# Patient Record
Sex: Female | Born: 1965 | Race: Black or African American | Hispanic: No | Marital: Single | Smoking: Never smoker
Health system: Southern US, Community
[De-identification: ages and names within clinical notes are randomized; demographics above are authoritative.]

## PROBLEM LIST (undated history)

## (undated) DIAGNOSIS — D649 Anemia, unspecified: Secondary | ICD-10-CM

## (undated) DIAGNOSIS — I10 Essential (primary) hypertension: Secondary | ICD-10-CM

## (undated) DIAGNOSIS — R87619 Unspecified abnormal cytological findings in specimens from cervix uteri: Secondary | ICD-10-CM

## (undated) DIAGNOSIS — D219 Benign neoplasm of connective and other soft tissue, unspecified: Secondary | ICD-10-CM

## (undated) DIAGNOSIS — L509 Urticaria, unspecified: Secondary | ICD-10-CM

## (undated) HISTORY — PX: CHOLECYSTECTOMY: SHX55

## (undated) HISTORY — DX: Essential (primary) hypertension: I10

## (undated) HISTORY — DX: Benign neoplasm of connective and other soft tissue, unspecified: D21.9

## (undated) HISTORY — PX: ROBOTIC ASSISTED LAPAROSCOPIC VAGINAL HYSTERECTOMY WITH FIBROID REMOVAL: SHX5387

## (undated) HISTORY — DX: Urticaria, unspecified: L50.9

## (undated) HISTORY — DX: Unspecified abnormal cytological findings in specimens from cervix uteri: R87.619

## (undated) HISTORY — PX: COLONOSCOPY: SHX174

## (undated) HISTORY — PX: ABDOMINAL HYSTERECTOMY: SHX81

---

## 1994-12-11 HISTORY — PX: MYOMECTOMY: SHX85

## 1997-12-21 ENCOUNTER — Emergency Department (HOSPITAL_COMMUNITY): Admission: EM | Admit: 1997-12-21 | Discharge: 1997-12-22 | Payer: Self-pay | Admitting: Emergency Medicine

## 1998-09-11 HISTORY — PX: HYSTEROSCOPY: SHX211

## 1998-09-28 ENCOUNTER — Ambulatory Visit (HOSPITAL_COMMUNITY): Admission: RE | Admit: 1998-09-28 | Discharge: 1998-09-28 | Payer: Self-pay | Admitting: Obstetrics and Gynecology

## 1998-12-29 ENCOUNTER — Other Ambulatory Visit: Admission: RE | Admit: 1998-12-29 | Discharge: 1998-12-29 | Payer: Self-pay | Admitting: *Deleted

## 1999-01-11 ENCOUNTER — Ambulatory Visit (HOSPITAL_COMMUNITY): Admission: RE | Admit: 1999-01-11 | Discharge: 1999-01-11 | Payer: Self-pay | Admitting: *Deleted

## 1999-03-08 ENCOUNTER — Observation Stay (HOSPITAL_COMMUNITY): Admission: AD | Admit: 1999-03-08 | Discharge: 1999-03-09 | Payer: Self-pay | Admitting: Obstetrics and Gynecology

## 1999-03-08 ENCOUNTER — Encounter: Payer: Self-pay | Admitting: Obstetrics and Gynecology

## 1999-05-13 ENCOUNTER — Encounter: Payer: Self-pay | Admitting: *Deleted

## 1999-05-13 ENCOUNTER — Ambulatory Visit (HOSPITAL_COMMUNITY): Admission: RE | Admit: 1999-05-13 | Discharge: 1999-05-13 | Payer: Self-pay | Admitting: *Deleted

## 1999-07-12 ENCOUNTER — Inpatient Hospital Stay (HOSPITAL_COMMUNITY): Admission: RE | Admit: 1999-07-12 | Discharge: 1999-07-15 | Payer: Self-pay | Admitting: Obstetrics and Gynecology

## 1999-07-12 ENCOUNTER — Encounter (INDEPENDENT_AMBULATORY_CARE_PROVIDER_SITE_OTHER): Payer: Self-pay

## 2001-04-22 ENCOUNTER — Other Ambulatory Visit: Admission: RE | Admit: 2001-04-22 | Discharge: 2001-04-22 | Payer: Self-pay | Admitting: *Deleted

## 2003-07-22 ENCOUNTER — Encounter: Admission: RE | Admit: 2003-07-22 | Discharge: 2003-07-22 | Payer: Self-pay | Admitting: *Deleted

## 2005-09-18 ENCOUNTER — Encounter: Admission: RE | Admit: 2005-09-18 | Discharge: 2005-09-18 | Payer: Self-pay | Admitting: *Deleted

## 2007-01-21 ENCOUNTER — Encounter: Admission: RE | Admit: 2007-01-21 | Discharge: 2007-01-21 | Payer: Self-pay | Admitting: Obstetrics and Gynecology

## 2008-02-05 ENCOUNTER — Encounter: Admission: RE | Admit: 2008-02-05 | Discharge: 2008-02-05 | Payer: Self-pay | Admitting: Obstetrics and Gynecology

## 2010-12-16 ENCOUNTER — Other Ambulatory Visit: Payer: Self-pay | Admitting: Obstetrics & Gynecology

## 2010-12-16 DIAGNOSIS — Z1231 Encounter for screening mammogram for malignant neoplasm of breast: Secondary | ICD-10-CM

## 2010-12-20 ENCOUNTER — Ambulatory Visit
Admission: RE | Admit: 2010-12-20 | Discharge: 2010-12-20 | Disposition: A | Discharge status: Home / Self Care | Payer: BC Managed Care – PPO | Source: Ambulatory Visit | Attending: Obstetrics & Gynecology | Admitting: Obstetrics & Gynecology

## 2010-12-20 DIAGNOSIS — Z1231 Encounter for screening mammogram for malignant neoplasm of breast: Secondary | ICD-10-CM

## 2010-12-23 ENCOUNTER — Other Ambulatory Visit: Payer: Self-pay | Admitting: Obstetrics & Gynecology

## 2010-12-23 DIAGNOSIS — R928 Other abnormal and inconclusive findings on diagnostic imaging of breast: Secondary | ICD-10-CM

## 2011-01-05 ENCOUNTER — Ambulatory Visit
Admission: RE | Admit: 2011-01-05 | Discharge: 2011-01-05 | Disposition: A | Discharge status: Home / Self Care | Payer: BC Managed Care – PPO | Source: Ambulatory Visit | Attending: Obstetrics & Gynecology | Admitting: Obstetrics & Gynecology

## 2011-01-05 DIAGNOSIS — R928 Other abnormal and inconclusive findings on diagnostic imaging of breast: Secondary | ICD-10-CM

## 2011-09-11 ENCOUNTER — Other Ambulatory Visit: Payer: Self-pay | Admitting: Obstetrics and Gynecology

## 2011-09-11 ENCOUNTER — Other Ambulatory Visit: Payer: Self-pay | Admitting: Obstetrics & Gynecology

## 2011-09-11 DIAGNOSIS — R921 Mammographic calcification found on diagnostic imaging of breast: Secondary | ICD-10-CM

## 2011-09-14 ENCOUNTER — Ambulatory Visit
Admission: RE | Admit: 2011-09-14 | Discharge: 2011-09-14 | Disposition: A | Discharge status: Home / Self Care | Payer: BC Managed Care – PPO | Source: Ambulatory Visit | Attending: Obstetrics & Gynecology | Admitting: Obstetrics & Gynecology

## 2011-09-14 DIAGNOSIS — R921 Mammographic calcification found on diagnostic imaging of breast: Secondary | ICD-10-CM

## 2011-12-11 ENCOUNTER — Other Ambulatory Visit: Payer: Self-pay | Admitting: Obstetrics & Gynecology

## 2011-12-11 DIAGNOSIS — Z1231 Encounter for screening mammogram for malignant neoplasm of breast: Secondary | ICD-10-CM

## 2011-12-21 ENCOUNTER — Ambulatory Visit
Admission: RE | Admit: 2011-12-21 | Discharge: 2011-12-21 | Disposition: A | Discharge status: Home / Self Care | Payer: BC Managed Care – PPO | Source: Ambulatory Visit | Attending: Obstetrics & Gynecology | Admitting: Obstetrics & Gynecology

## 2011-12-21 DIAGNOSIS — Z1231 Encounter for screening mammogram for malignant neoplasm of breast: Secondary | ICD-10-CM

## 2012-12-16 ENCOUNTER — Encounter: Payer: Self-pay | Admitting: Certified Nurse Midwife

## 2012-12-18 ENCOUNTER — Other Ambulatory Visit: Payer: Self-pay

## 2012-12-18 DIAGNOSIS — Z1231 Encounter for screening mammogram for malignant neoplasm of breast: Secondary | ICD-10-CM

## 2012-12-23 ENCOUNTER — Encounter: Payer: Self-pay | Admitting: Certified Nurse Midwife

## 2012-12-23 ENCOUNTER — Ambulatory Visit (INDEPENDENT_AMBULATORY_CARE_PROVIDER_SITE_OTHER): Payer: BC Managed Care – PPO | Admitting: Certified Nurse Midwife

## 2012-12-23 VITALS — BP 120/80 | HR 64 | Resp 16 | Ht 60.25 in | Wt 172.0 lb

## 2012-12-23 DIAGNOSIS — Z0000 Encounter for general adult medical examination without abnormal findings: Secondary | ICD-10-CM

## 2012-12-23 DIAGNOSIS — Z01419 Encounter for gynecological examination (general) (routine) without abnormal findings: Secondary | ICD-10-CM

## 2012-12-23 LAB — POCT URINALYSIS DIPSTICK
Bilirubin, UA: NEGATIVE
Leukocytes, UA: NEGATIVE
Nitrite, UA: NEGATIVE
Protein, UA: NEGATIVE
pH, UA: 5

## 2012-12-23 NOTE — Patient Instructions (Signed)

## 2012-12-23 NOTE — Progress Notes (Signed)
47 y.o. G0P0000 Single African American Fe here for annual exam.  Denies vaginal bleeding or vaginal dryness.No health issues today. Sees PCP prn only. Working on weight loss with diet change and exercise now. Not sexually active  Patient's last menstrual period was 06/13/1999.          Sexually active: no  The current method of family planning is abstinence.    Exercising: yes  walking Smoker:  no  Health Maintenance: Pap:  12-04-08 neg MMG:  12-22-11 normal Colonoscopy:  none BMD:   none TDaP:  2013 Labs: Poct urine-neg, Hgb-13.7 Self breast exam: done monthly     Past Medical History  Diagnosis Date  . Leiomyoma   . Fibroid     Past Surgical History  Procedure Laterality Date  . Cholecystectomy    . Myomectomy  7/96    multiple  . Robotic assisted laparoscopic vaginal hysterectomy with fibroid removal    . Hysteroscopy  4/00    fibroid hysteroscopic resection  . Abdominal hysterectomy      TAH/LOA    Current Outpatient Prescriptions  Medication Sig Dispense Refill  . Cholecalciferol (VITAMIN D PO) Take by mouth. occ      . Multiple Vitamins-Minerals (MULTIVITAMIN PO) Take by mouth. occ       No current facility-administered medications for this visit.    Family History  Problem Relation Age of Onset  . Hypertension Mother   . Hypertension Father     ROS:  Pertinent items are noted in HPI.  Otherwise, a comprehensive ROS was negative.  Exam:   LMP 06/13/1999    Ht Readings from Last 3 Encounters:  No data found for Ht    General appearance: alert, cooperative and appears stated age Head: Normocephalic, without obvious abnormality, atraumatic Neck: no adenopathy, supple, symmetrical, trachea midline and thyroid normal to inspection and palpation Lungs: clear to auscultation bilaterally Breasts: normal appearance, no masses or tenderness, No nipple retraction or dimpling, No nipple discharge or bleeding, No axillary or supraclavicular adenopathy Heart:  regular rate and rhythm Abdomen: soft, non-tender; no masses,  no organomegaly Extremities: extremities normal, atraumatic, no cyanosis or edema Skin: Skin color, texture, turgor normal. No rashes or lesions Lymph nodes: Cervical, supraclavicular, and axillary nodes normal. No abnormal inguinal nodes palpated Neurologic: Grossly normal   Pelvic: External genitalia:  no lesions              Urethra:  normal appearing urethra with no masses, tenderness or lesions              Bartholin's and Skene's: normal                 Vagina: normal appearing vagina with normal color and discharge, no lesions              Cervix: absent              Pap taken: no Bimanual Exam:  Uterus:  uterus absent              Adnexa: normal adnexa, no mass, fullness, tenderness and  on right, left absent               Rectovaginal: Confirms               Anus:  normal sphincter tone, no lesions  A:  Well Woman with normal exam  S/PTAH/ LSO   Due to bleeding history of fibroids  P:   Reviewed health and wellness pertinent  to exam  Labs: CMP, Lipid, TSH, Vitamin D(fasting)   Pap smear as per guidelines   Mammogram yearly pap smear not taken today  counseled on breast self exam, mammography screening, adequate intake of calcium and vitamin D, continue to work on weight to decrease health risk  return annually or prn  An After Visit Summary was printed and given to the patient.  Reviewed, TL

## 2012-12-24 ENCOUNTER — Telehealth: Payer: Self-pay

## 2012-12-24 LAB — COMPREHENSIVE METABOLIC PANEL
AST: 14 U/L (ref 0–37)
Albumin: 4.1 g/dL (ref 3.5–5.2)
Alkaline Phosphatase: 37 U/L — ABNORMAL LOW (ref 39–117)
Potassium: 4.2 mEq/L (ref 3.5–5.3)
Sodium: 141 mEq/L (ref 135–145)
Total Protein: 6.7 g/dL (ref 6.0–8.3)

## 2012-12-24 LAB — LIPID PANEL
Total CHOL/HDL Ratio: 3.5 Ratio
VLDL: 20 mg/dL (ref 0–40)

## 2012-12-24 LAB — TSH: TSH: 2.087 u[IU]/mL (ref 0.350–4.500)

## 2012-12-24 NOTE — Telephone Encounter (Signed)
lmtcb

## 2012-12-24 NOTE — Telephone Encounter (Signed)
Patient returning Joy's call.  °

## 2012-12-24 NOTE — Telephone Encounter (Signed)
Message copied by Eliezer Bottom on Tue Dec 24, 2012  9:11 AM ------      Message from: Verner Chol      Created: Tue Dec 24, 2012  8:14 AM       Notify TSH, liver, kidney, glucose profile normal      Vitamin D low, protocol      Cholesterol borderline high 208 should be  Less than 200, work on exercise as we discussed      LDL optimal level ------

## 2012-12-24 NOTE — Telephone Encounter (Signed)
Patient notified of results.

## 2013-01-07 ENCOUNTER — Ambulatory Visit
Admission: RE | Admit: 2013-01-07 | Discharge: 2013-01-07 | Disposition: A | Discharge status: Home / Self Care | Payer: BC Managed Care – PPO | Source: Ambulatory Visit

## 2013-01-07 DIAGNOSIS — Z1231 Encounter for screening mammogram for malignant neoplasm of breast: Secondary | ICD-10-CM

## 2013-12-05 ENCOUNTER — Other Ambulatory Visit: Payer: Self-pay

## 2013-12-05 DIAGNOSIS — Z1231 Encounter for screening mammogram for malignant neoplasm of breast: Secondary | ICD-10-CM

## 2013-12-25 ENCOUNTER — Ambulatory Visit (INDEPENDENT_AMBULATORY_CARE_PROVIDER_SITE_OTHER): Payer: BC Managed Care – PPO | Admitting: Certified Nurse Midwife

## 2013-12-25 ENCOUNTER — Encounter: Payer: Self-pay | Admitting: Certified Nurse Midwife

## 2013-12-25 VITALS — BP 138/90 | HR 72 | Ht 60.25 in | Wt 170.0 lb

## 2013-12-25 DIAGNOSIS — Z01419 Encounter for gynecological examination (general) (routine) without abnormal findings: Secondary | ICD-10-CM

## 2013-12-25 DIAGNOSIS — Z0000 Encounter for general adult medical examination without abnormal findings: Secondary | ICD-10-CM

## 2013-12-25 LAB — COMPREHENSIVE METABOLIC PANEL
ALK PHOS: 46 U/L (ref 39–117)
ALT: 13 U/L (ref 0–35)
AST: 16 U/L (ref 0–37)
Albumin: 4 g/dL (ref 3.5–5.2)
BILIRUBIN TOTAL: 0.4 mg/dL (ref 0.2–1.2)
BUN: 10 mg/dL (ref 6–23)
CO2: 29 mEq/L (ref 19–32)
Calcium: 9.7 mg/dL (ref 8.4–10.5)
Chloride: 100 mEq/L (ref 96–112)
Creat: 0.79 mg/dL (ref 0.50–1.10)
GLUCOSE: 93 mg/dL (ref 70–99)
POTASSIUM: 3.7 meq/L (ref 3.5–5.3)
SODIUM: 138 meq/L (ref 135–145)
TOTAL PROTEIN: 7.2 g/dL (ref 6.0–8.3)

## 2013-12-25 LAB — POCT URINALYSIS DIPSTICK
BILIRUBIN UA: NEGATIVE
Blood, UA: NEGATIVE
GLUCOSE UA: NEGATIVE
KETONES UA: NEGATIVE
LEUKOCYTES UA: NEGATIVE
NITRITE UA: NEGATIVE
PH UA: 6
Protein, UA: NEGATIVE
Urobilinogen, UA: NEGATIVE

## 2013-12-25 LAB — LIPID PANEL
Cholesterol: 189 mg/dL (ref 0–200)
HDL: 60 mg/dL (ref >39)
LDL CALC: 97 mg/dL (ref 0–99)
TRIGLYCERIDES: 162 mg/dL — AB (ref <150)
Total CHOL/HDL Ratio: 3.2 Ratio
VLDL: 32 mg/dL (ref 0–40)

## 2013-12-25 LAB — HEMOGLOBIN, FINGERSTICK: HEMOGLOBIN, FINGERSTICK: 13.3 g/dL (ref 12.0–16.0)

## 2013-12-25 NOTE — Progress Notes (Signed)
48 y.o. G0P0000 Single African American Fe here for annual exam. Perimenopausal with occasional hot flashes and night sweats. Patient has stopped large amounts of caffeine with coffee and started to use decaf tea with less hot flashes. Working on weight loss now and looking fora exercise she likes. Not sexually active in a long time. No STD screening needed. Sees PCP prn. Fasted for labs this am. No other health issues today.  Patient's last menstrual period was 07/12/1999.          Sexually active: No.  The current method of family planning is status post hysterectomy.    Exercising: No.  The patient does not participate in regular exercise at present. Smoker:  no  Health Maintenance: Pap:  12/04/2008 MMG:  12/18/12, WNL scheduled Category C, Birads negative 1 Colonoscopy:  never BMD:   never TDaP:  2013 Labs: Hgb-  13.3           Urine-Normal   reports that she has never smoked. She does not have any smokeless tobacco history on file. She reports that she does not drink alcohol or use illicit drugs.  Past Medical History  Diagnosis Date  . Leiomyoma   . Fibroid     Past Surgical History  Procedure Laterality Date  . Cholecystectomy    . Myomectomy  7/96    multiple  . Robotic assisted laparoscopic vaginal hysterectomy with fibroid removal    . Hysteroscopy  4/00    fibroid hysteroscopic resection  . Abdominal hysterectomy      TAH/LOA    Current Outpatient Prescriptions  Medication Sig Dispense Refill  . EPINEPHrine (EPI-PEN) 0.3 mg/0.3 mL SOAJ Inject into the muscle once. prn      . Multiple Vitamins-Minerals (MULTIVITAMIN PO) Take by mouth. occ      . Cholecalciferol (VITAMIN D PO) Take by mouth. occ      . COLLAGEN PO Take by mouth. occ       No current facility-administered medications for this visit.    Family History  Problem Relation Age of Onset  . Hypertension Mother   . Hypertension Father   . Diabetes Paternal Aunt   . Cancer Paternal Uncle     bone  .  Cancer Paternal Grandmother     bone    ROS:  Pertinent items are noted in HPI.  Otherwise, a comprehensive ROS was negative.  Exam:   BP 138/90  Pulse 72  Ht 5' 0.25" (1.53 m)  Wt 170 lb (77.111 kg)  BMI 32.94 kg/m2  LMP 07/12/1999 Height: 5' 0.25" (153 cm)  Ht Readings from Last 3 Encounters:  12/25/13 5' 0.25" (1.53 m)  12/23/12 5' 0.25" (1.53 m)    General appearance: alert, cooperative and appears stated age Head: Normocephalic, without obvious abnormality, atraumatic Neck: no adenopathy, supple, symmetrical, trachea midline and thyroid normal to inspection and palpation and non-palpable Lungs: clear to auscultation bilaterally Breasts: normal appearance, no masses or tenderness, No nipple retraction or dimpling, No nipple discharge or bleeding, No axillary or supraclavicular adenopathy Heart: regular rate and rhythm Abdomen: soft, non-tender; no masses,  no organomegaly Extremities: extremities normal, atraumatic, no cyanosis or edema Skin: Skin color, texture, turgor normal. No rashes or lesions Lymph nodes: Cervical, supraclavicular, and axillary nodes normal. No abnormal inguinal nodes palpated Neurologic: Grossly normal   Pelvic: External genitalia:  no lesions              Urethra:  normal appearing urethra with no masses, tenderness or lesions  Bartholin's and Skene's: normal                 Vagina: normal appearing vagina with normal color and discharge, no lesions              Cervix: absent              Pap taken: No. Bimanual Exam:  Uterus:  uterus absent              Adnexa: normal adnexa and no mass, fullness, tenderness               Rectovaginal: Confirms               Anus:  normal sphincter tone, no lesions  A:  Well Woman with normal exam  Contraception none, S/P TAH for fibroid ovaries retained  History of Vitamin D deficicency  P:   Reviewed health and wellness pertinent to exam  Labs:CMP,Lipid panel, Vitamin D  Pap smear not taken  today   counseled on breast self exam, mammography screening, menopause, adequate intake of calcium and vitamin D, diet and exercise  return annually or prn  An After Visit Summary was printed and given to the patient.

## 2013-12-25 NOTE — Progress Notes (Signed)
Reviewed personally.  M. Suzanne Ottilie Wigglesworth, MD.  

## 2013-12-26 LAB — VITAMIN D 25 HYDROXY (VIT D DEFICIENCY, FRACTURES): Vit D, 25-Hydroxy: 30 ng/mL (ref 30–89)

## 2014-01-08 ENCOUNTER — Ambulatory Visit
Admission: RE | Admit: 2014-01-08 | Discharge: 2014-01-08 | Disposition: A | Discharge status: Home / Self Care | Payer: BC Managed Care – PPO | Source: Ambulatory Visit

## 2014-01-08 DIAGNOSIS — Z1231 Encounter for screening mammogram for malignant neoplasm of breast: Secondary | ICD-10-CM

## 2014-12-09 ENCOUNTER — Other Ambulatory Visit: Payer: Self-pay

## 2014-12-09 DIAGNOSIS — Z1231 Encounter for screening mammogram for malignant neoplasm of breast: Secondary | ICD-10-CM

## 2014-12-29 ENCOUNTER — Ambulatory Visit (INDEPENDENT_AMBULATORY_CARE_PROVIDER_SITE_OTHER): Payer: BC Managed Care – PPO | Admitting: Certified Nurse Midwife

## 2014-12-29 ENCOUNTER — Encounter: Payer: Self-pay | Admitting: Certified Nurse Midwife

## 2014-12-29 VITALS — BP 120/70 | HR 80 | Resp 16 | Ht 60.0 in | Wt 165.4 lb

## 2014-12-29 DIAGNOSIS — Z01419 Encounter for gynecological examination (general) (routine) without abnormal findings: Secondary | ICD-10-CM | POA: Diagnosis not present

## 2014-12-29 DIAGNOSIS — Z0000 Encounter for general adult medical examination without abnormal findings: Secondary | ICD-10-CM

## 2014-12-29 LAB — LIPID PANEL
Cholesterol: 202 mg/dL — ABNORMAL HIGH (ref 0–200)
HDL: 66 mg/dL (ref >=46)
LDL Cholesterol: 115 mg/dL — ABNORMAL HIGH (ref 0–99)
Total CHOL/HDL Ratio: 3.1 Ratio
Triglycerides: 103 mg/dL (ref <150)
VLDL: 21 mg/dL (ref 0–40)

## 2014-12-29 LAB — POCT URINALYSIS DIPSTICK
LEUKOCYTES UA: NEGATIVE
UROBILINOGEN UA: NEGATIVE
pH, UA: 5

## 2014-12-29 LAB — HEMOGLOBIN, FINGERSTICK: Hemoglobin, fingerstick: 13.4 g/dL (ref 12.0–16.0)

## 2014-12-29 NOTE — Progress Notes (Signed)
49 y.o. G0P0000 Single  African American Fe here for annual exam. Perimenopausal with occasional hot flash with hot foods only.            Denies vaginal bleeding or vaginal dryness. 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 Continues to work on weight loss and reducing salt in diet. Sees Urgent care if needed. No health issues today.   Patient's last menstrual period was 07/12/1999.          Sexually active: No.  The current method of family planning is status post hysterectomy.    Exercising: Yes.    walking Smoker:  no  Health Maintenance: Pap:  12/04/2008 wnl MMG:  01/08/14 breast density cat c; bi-rads 1: negative, has scheduled in 8/16 Colonoscopy:  Never had one, plans after turn 49  BMD:   Never had one TDaP:  12/19/2011 Labs: HGB: 13.4 ; Urine: Negative    reports that she has never smoked. She does not have any smokeless tobacco history on file. She reports that she does not drink alcohol or use illicit drugs.  Past Medical History  Diagnosis Date  . Leiomyoma   . Fibroid     Past Surgical History  Procedure Laterality Date  . Cholecystectomy    . Myomectomy  7/96    multiple  . Robotic assisted laparoscopic vaginal hysterectomy with fibroid removal    . Hysteroscopy  4/00    fibroid hysteroscopic resection  . Abdominal hysterectomy      TAH/LOA    Current Outpatient Prescriptions  Medication Sig Dispense Refill  . Cholecalciferol (VITAMIN D PO) Take by mouth. occ    . EPINEPHrine (EPI-PEN) 0.3 mg/0.3 mL SOAJ Inject into the muscle once. prn    . Multiple Vitamins-Minerals (MULTIVITAMIN PO) Take by mouth. occ    . COLLAGEN PO Take by mouth. occ     No current facility-administered medications for this visit.    Family History  Problem Relation Age of Onset  . Hypertension Mother   . Hypertension Father   . Diabetes Paternal Aunt   . Cancer Paternal Uncle     bone  . Cancer Paternal Grandmother     bone     ROS:  Pertinent items are noted in HPI.  Otherwise, a comprehensive ROS was negative.  Exam:   BP 136/78 mmHg  Pulse 82  Ht 5' (1.524 m)  Wt 165 lb 6.4 oz (75.025 kg)  BMI 32.30 kg/m2  LMP 07/12/1999 Height: 5' (152.4 cm) Ht Readings from Last 3 Encounters:  12/29/14 5' (1.524 m)  12/25/13 5' 0.25" (1.53 m)  12/23/12 5' 0.25" (1.53 m)    General appearance: alert, cooperative and appears stated age Head: Normocephalic, without obvious abnormality, atraumatic Neck: no adenopathy, supple, symmetrical, trachea midline and thyroid normal to inspection and palpation Lungs: clear to auscultation bilaterally Breasts: normal appearance, no masses or tenderness, No nipple retraction or dimpling, No nipple discharge or bleeding, No axillary or supraclavicular adenopathy Heart: regular rate and rhythm Abdomen: soft, non-tender; no masses,  no organomegaly Extremities: extremities normal, atraumatic, no cyanosis or edema Skin: Skin color, texture, turgor normal. No rashes or lesions Lymph nodes: Cervical, supraclavicular, and axillary nodes normal. No abnormal inguinal nodes palpated Neurologic: Grossly normal   Pelvic: External genitalia:  no lesions              Urethra:  normal appearing urethra with no masses, tenderness or lesions  Bartholin's and Skene's: normal                 Vagina: normal appearing vagina with normal color and discharge, no lesions              Cervix: absent              Pap taken: No. Bimanual Exam:  Uterus:  normal size, contour, position, consistency, mobility, non-tender              Adnexa: normal adnexa and no mass, fullness, tenderness               Rectovaginal: Confirms               Anus:  normal sphincter tone, no lesions  Chaperone present: Yes  A:  Well Woman with normal exam  Perimenopausal S/P TAH with ovaries retained fibroids  Screening labs   P:   Reviewed health and wellness pertinent to exam  Patient aware of changes  with perimenopause and will advise if concerns.  Labs: TSH, Lipid Panel, Vitamin D  Pap smear not taken   counseled on breast self exam, mammography screening, STD prevention, HIV risk factors and prevention, adequate intake of calcium and vitamin D, diet and exercise  return annually or prn  An After Visit Summary was printed and given to the patient.

## 2014-12-29 NOTE — Patient Instructions (Signed)

## 2014-12-29 NOTE — Progress Notes (Signed)
Reviewed personally.  M. Suzanne Traquan Duarte, MD.  

## 2014-12-30 ENCOUNTER — Telehealth: Payer: Self-pay

## 2014-12-30 ENCOUNTER — Other Ambulatory Visit: Payer: Self-pay

## 2014-12-30 DIAGNOSIS — E559 Vitamin D deficiency, unspecified: Secondary | ICD-10-CM

## 2014-12-30 LAB — TSH: TSH: 1.579 u[IU]/mL (ref 0.350–4.500)

## 2014-12-30 LAB — VITAMIN D 25 HYDROXY (VIT D DEFICIENCY, FRACTURES): VIT D 25 HYDROXY: 17 ng/mL — AB (ref 30–100)

## 2014-12-30 MED ORDER — VITAMIN D (ERGOCALCIFEROL) 1.25 MG (50000 UNIT) PO CAPS: 50000.0000 [IU] | ORAL_CAPSULE | ORAL | Status: DC

## 2014-12-30 NOTE — Telephone Encounter (Signed)
rx for vit d 50,000iu once weekly for 78mths. See lab results

## 2014-12-30 NOTE — Telephone Encounter (Signed)
lmtb

## 2014-12-30 NOTE — Telephone Encounter (Signed)
Patient notified of results. See lab 

## 2014-12-30 NOTE — Telephone Encounter (Signed)
-----   Message from Regina Eck, CNM sent at 12/30/2014  8:24 AM EDT ----- Notify patient that vitamin D is low again protocol TSH is normal Lipid panel near optimal level

## 2015-01-11 ENCOUNTER — Ambulatory Visit
Admission: RE | Admit: 2015-01-11 | Discharge: 2015-01-11 | Disposition: A | Discharge status: Home / Self Care | Payer: BC Managed Care – PPO | Source: Ambulatory Visit

## 2015-01-11 DIAGNOSIS — Z1231 Encounter for screening mammogram for malignant neoplasm of breast: Secondary | ICD-10-CM

## 2015-05-03 ENCOUNTER — Other Ambulatory Visit (INDEPENDENT_AMBULATORY_CARE_PROVIDER_SITE_OTHER): Payer: BC Managed Care – PPO

## 2015-05-03 DIAGNOSIS — E559 Vitamin D deficiency, unspecified: Secondary | ICD-10-CM

## 2015-05-04 LAB — VITAMIN D 25 HYDROXY (VIT D DEFICIENCY, FRACTURES): VIT D 25 HYDROXY: 25 ng/mL — AB (ref 30–100)

## 2015-06-13 HISTORY — PX: BREAST BIOPSY: SHX20

## 2015-12-24 ENCOUNTER — Other Ambulatory Visit: Payer: Self-pay | Admitting: Certified Nurse Midwife

## 2015-12-24 ENCOUNTER — Other Ambulatory Visit: Payer: Self-pay | Admitting: General Practice

## 2015-12-24 DIAGNOSIS — Z1231 Encounter for screening mammogram for malignant neoplasm of breast: Secondary | ICD-10-CM

## 2015-12-31 ENCOUNTER — Ambulatory Visit: Payer: BC Managed Care – PPO | Admitting: Certified Nurse Midwife

## 2016-01-06 ENCOUNTER — Ambulatory Visit (INDEPENDENT_AMBULATORY_CARE_PROVIDER_SITE_OTHER): Payer: BC Managed Care – PPO | Admitting: Certified Nurse Midwife

## 2016-01-06 ENCOUNTER — Encounter: Payer: Self-pay | Admitting: Certified Nurse Midwife

## 2016-01-06 VITALS — BP 110/70 | HR 70 | Resp 16 | Ht 60.25 in | Wt 169.0 lb

## 2016-01-06 DIAGNOSIS — Z01419 Encounter for gynecological examination (general) (routine) without abnormal findings: Secondary | ICD-10-CM

## 2016-01-06 DIAGNOSIS — Z0000 Encounter for general adult medical examination without abnormal findings: Secondary | ICD-10-CM | POA: Diagnosis not present

## 2016-01-06 DIAGNOSIS — R319 Hematuria, unspecified: Secondary | ICD-10-CM

## 2016-01-06 LAB — COMPREHENSIVE METABOLIC PANEL
ALBUMIN: 4.1 g/dL (ref 3.6–5.1)
ALT: 12 U/L (ref 6–29)
AST: 17 U/L (ref 10–35)
Alkaline Phosphatase: 59 U/L (ref 33–130)
BILIRUBIN TOTAL: 0.4 mg/dL (ref 0.2–1.2)
BUN: 8 mg/dL (ref 7–25)
CO2: 25 mmol/L (ref 20–31)
CREATININE: 0.81 mg/dL (ref 0.50–1.05)
Calcium: 9.3 mg/dL (ref 8.6–10.4)
Chloride: 103 mmol/L (ref 98–110)
Glucose, Bld: 90 mg/dL (ref 65–99)
Potassium: 3.8 mmol/L (ref 3.5–5.3)
SODIUM: 139 mmol/L (ref 135–146)
TOTAL PROTEIN: 7.1 g/dL (ref 6.1–8.1)

## 2016-01-06 LAB — POCT URINALYSIS DIPSTICK
Bilirubin, UA: NEGATIVE
Glucose, UA: NEGATIVE
Ketones, UA: NEGATIVE
Leukocytes, UA: NEGATIVE
NITRITE UA: NEGATIVE
PH UA: 5
Protein, UA: NEGATIVE
UROBILINOGEN UA: NEGATIVE

## 2016-01-06 LAB — LIPID PANEL
Cholesterol: 195 mg/dL (ref 125–200)
HDL: 61 mg/dL (ref >=46)
LDL Cholesterol: 95 mg/dL (ref <130)
Total CHOL/HDL Ratio: 3.2 Ratio (ref <=5.0)
Triglycerides: 193 mg/dL — ABNORMAL HIGH (ref <150)
VLDL: 39 mg/dL — ABNORMAL HIGH (ref <30)

## 2016-01-06 LAB — TSH: TSH: 1.13 m[IU]/L

## 2016-01-06 NOTE — Progress Notes (Signed)
50 y.o. G0P0000 Single  African American Fe here for annual exam. Menopausal no HRT. Denies vaginal dryness or vaginal bleeding. No UTI symptoms. Has not noted blood in urine. Drinking adequate water daily. Sees Urgent care if needed. No health issues today. Still struggling with taking Vitamin D supplement, but trying. Enjoying her summer!2   Patient's last menstrual period was 07/12/1999.          Sexually active: No.  The current method of family planning is status post hysterectomy.    Exercising: Yes.    walking Smoker:  no  Health Maintenance: Pap:  12-04-08 neg MMG:  01-25-15 neg scheduled already Colonoscopy:  none BMD:   none TDaP:  2013 Shingles: no Pneumonia: no Hep C and HIV: yrs ago Labs: poct urine -rbc tr, hgb-12.6 Self breast exam: done monthly   reports that she has never smoked. She does not have any smokeless tobacco history on file. She reports that she does not drink alcohol or use drugs.  Past Medical History:  Diagnosis Date  . Fibroid   . Leiomyoma     Past Surgical History:  Procedure Laterality Date  . ABDOMINAL HYSTERECTOMY     TAH/LOA  . CHOLECYSTECTOMY    . HYSTEROSCOPY  4/00   fibroid hysteroscopic resection  . MYOMECTOMY  7/96   multiple  . ROBOTIC ASSISTED LAPAROSCOPIC VAGINAL HYSTERECTOMY WITH FIBROID REMOVAL      Current Outpatient Prescriptions  Medication Sig Dispense Refill  . Cholecalciferol (VITAMIN D PO) Take by mouth. occ    . COLLAGEN PO Take by mouth. occ    . EPINEPHrine (EPI-PEN) 0.3 mg/0.3 mL SOAJ Inject into the muscle once. prn    . Multiple Vitamins-Minerals (MULTIVITAMIN PO) Take by mouth. occ    . Vitamin D, Ergocalciferol, (DRISDOL) 50000 UNITS CAPS capsule Take 1 capsule (50,000 Units total) by mouth every 7 (seven) days. 8 capsule 0   No current facility-administered medications for this visit.     Family History  Problem Relation Age of Onset  . Hypertension Mother   . Hypertension Father   . Diabetes  Paternal Aunt   . Cancer Paternal Uncle     bone  . Cancer Paternal Grandmother     bone    ROS:  Pertinent items are noted in HPI.  Otherwise, a comprehensive ROS was negative.  Exam:   LMP 07/12/1999    Ht Readings from Last 3 Encounters:  12/29/14 5' (1.524 m)  12/25/13 5' 0.25" (1.53 m)  12/23/12 5' 0.25" (1.53 m)    General appearance: alert, cooperative and appears stated age Head: Normocephalic, without obvious abnormality, atraumatic Neck: no adenopathy, supple, symmetrical, trachea midline and thyroid normal to inspection and palpation Lungs: clear to auscultation bilaterally CVAT negative bilateral Breasts: normal appearance, no masses or tenderness, No nipple retraction or dimpling, No nipple discharge or bleeding, No axillary or supraclavicular adenopathy Heart: regular rate and rhythm Abdomen: soft, non-tender; no masses,  no organomegaly, negative suprapubic Extremities: extremities normal, atraumatic, no cyanosis or edema Skin: Skin color, texture, turgor normal. No rashes or lesions, warm and dry Lymph nodes: Cervical, supraclavicular, and axillary nodes normal. No abnormal inguinal nodes palpated Neurologic: Grossly normal   Pelvic: External genitalia:  no lesions, normal female              Urethra:  normal appearing urethra with no masses, tenderness or lesions  Bladder and urethral meatus non tender  Bartholin's and Skene's: normal                 Vagina: normal appearing vagina with normal color and discharge, no lesions              Cervix: absent              Pap taken: No. Bimanual Exam:  Uterus:  uterus absent              Adnexa: no mass, fullness, tenderness, adnexa not palpable, large masses  noted               Rectovaginal: Confirms               Anus:  normal sphincter tone, no lesions  Chaperone present: yes  A:  Well Woman with normal exam  Perimenopausal not symptomatic  S/P TAH/LOA due to fibroid and bleeding  Screening  labs  P:   Reviewed health and wellness pertinent to exam  Discussed etiology of perimenopause and handout given.  R/O UTI hematuria  Lab: Vitamin D, TSH,CMP,Lipid panel  Pap smear as above not taken   counseled on breast self exam, mammography screening, adequate intake of calcium and vitamin D, diet and exercise   Rv aex, prn  An After Visit Summary was printed and given to the patient.

## 2016-01-06 NOTE — Patient Instructions (Signed)

## 2016-01-07 ENCOUNTER — Other Ambulatory Visit: Payer: Self-pay

## 2016-01-07 ENCOUNTER — Telehealth: Payer: Self-pay | Admitting: Certified Nurse Midwife

## 2016-01-07 DIAGNOSIS — E559 Vitamin D deficiency, unspecified: Secondary | ICD-10-CM

## 2016-01-07 LAB — URINALYSIS, MICROSCOPIC ONLY
Bacteria, UA: NONE SEEN [HPF]
CASTS: NONE SEEN [LPF]
CRYSTALS: NONE SEEN [HPF]
RBC / HPF: NONE SEEN RBC/HPF (ref <=2)
WBC, UA: NONE SEEN WBC/HPF (ref <=5)
YEAST: NONE SEEN [HPF]

## 2016-01-07 LAB — HEMOGLOBIN, FINGERSTICK: Hemoglobin, fingerstick: 12.6 g/dL (ref 12.0–16.0)

## 2016-01-07 LAB — VITAMIN D 25 HYDROXY (VIT D DEFICIENCY, FRACTURES): VIT D 25 HYDROXY: 19 ng/mL — AB (ref 30–100)

## 2016-01-07 MED ORDER — VITAMIN D (ERGOCALCIFEROL) 1.25 MG (50000 UNIT) PO CAPS: 50000.0000 [IU] | ORAL_CAPSULE | ORAL | 0 refills | Status: DC

## 2016-01-07 NOTE — Telephone Encounter (Signed)
Patient states she received call from Caryl Asp, Mrs Debbi's nurse assistant. States she provided a voicemail with her lab results and stated she needs a 3 month lab follow up to recheck her vitamin d. States the voicemail stated a vitamin D prescription was sent to her pharmacy. 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  Scheduled for 04/10/16 @ 330pm.  Patient agreeable to return call from nurse if office visit required instead of a lab only.  Original message to patient recorded in labs.

## 2016-01-10 NOTE — Progress Notes (Signed)
Encounter reviewed Cindy Wessler, MD   

## 2016-01-12 ENCOUNTER — Ambulatory Visit
Admission: RE | Admit: 2016-01-12 | Discharge: 2016-01-12 | Disposition: A | Discharge status: Home / Self Care | Payer: BC Managed Care – PPO | Source: Ambulatory Visit | Attending: Cardiology | Admitting: Cardiology

## 2016-01-12 DIAGNOSIS — Z1231 Encounter for screening mammogram for malignant neoplasm of breast: Secondary | ICD-10-CM

## 2016-01-13 ENCOUNTER — Other Ambulatory Visit: Payer: Self-pay | Admitting: Certified Nurse Midwife

## 2016-01-13 DIAGNOSIS — R928 Other abnormal and inconclusive findings on diagnostic imaging of breast: Secondary | ICD-10-CM

## 2016-01-20 ENCOUNTER — Ambulatory Visit
Admission: RE | Admit: 2016-01-20 | Discharge: 2016-01-20 | Disposition: A | Discharge status: Home / Self Care | Payer: BC Managed Care – PPO | Source: Ambulatory Visit | Attending: Certified Nurse Midwife | Admitting: Certified Nurse Midwife

## 2016-01-20 ENCOUNTER — Other Ambulatory Visit: Payer: Self-pay | Admitting: Certified Nurse Midwife

## 2016-01-20 DIAGNOSIS — R928 Other abnormal and inconclusive findings on diagnostic imaging of breast: Secondary | ICD-10-CM

## 2016-01-25 ENCOUNTER — Other Ambulatory Visit: Payer: Self-pay | Admitting: Certified Nurse Midwife

## 2016-01-25 DIAGNOSIS — R928 Other abnormal and inconclusive findings on diagnostic imaging of breast: Secondary | ICD-10-CM

## 2016-01-26 ENCOUNTER — Ambulatory Visit
Admission: RE | Admit: 2016-01-26 | Discharge: 2016-01-26 | Disposition: A | Discharge status: Home / Self Care | Payer: BC Managed Care – PPO | Source: Ambulatory Visit | Attending: Certified Nurse Midwife | Admitting: Certified Nurse Midwife

## 2016-01-26 DIAGNOSIS — R928 Other abnormal and inconclusive findings on diagnostic imaging of breast: Secondary | ICD-10-CM

## 2016-04-10 ENCOUNTER — Other Ambulatory Visit: Payer: BC Managed Care – PPO

## 2016-04-10 DIAGNOSIS — E559 Vitamin D deficiency, unspecified: Secondary | ICD-10-CM

## 2016-04-11 ENCOUNTER — Other Ambulatory Visit: Payer: Self-pay | Admitting: Certified Nurse Midwife

## 2016-04-11 ENCOUNTER — Other Ambulatory Visit: Payer: Self-pay

## 2016-04-11 DIAGNOSIS — E559 Vitamin D deficiency, unspecified: Secondary | ICD-10-CM

## 2016-04-11 LAB — VITAMIN D 25 HYDROXY (VIT D DEFICIENCY, FRACTURES): VIT D 25 HYDROXY: 33 ng/mL (ref 30–100)

## 2016-04-11 MED ORDER — VITAMIN D (ERGOCALCIFEROL) 1.25 MG (50000 UNIT) PO CAPS
ORAL_CAPSULE | ORAL | 0 refills | Status: DC
Start: 2016-04-11 — End: 2017-01-09

## 2016-04-11 NOTE — Progress Notes (Signed)
Once monthly

## 2016-08-15 ENCOUNTER — Other Ambulatory Visit: Payer: Self-pay | Admitting: Certified Nurse Midwife

## 2016-08-15 ENCOUNTER — Telehealth: Payer: Self-pay | Admitting: *Deleted

## 2016-08-15 DIAGNOSIS — R921 Mammographic calcification found on diagnostic imaging of breast: Secondary | ICD-10-CM

## 2016-08-15 NOTE — Telephone Encounter (Signed)
Patient in 04 recall for 6 month F/U Right DX MMG. Please contact patient to schedule. Thanks

## 2016-08-15 NOTE — Telephone Encounter (Signed)
I scheduled patient appt for Thursday march 15th at 3:30pm. Pt aware of appt date & time & agreeable that this works for her. An order will be faxed over for provider to sign.

## 2016-08-24 ENCOUNTER — Ambulatory Visit
Admission: RE | Admit: 2016-08-24 | Discharge: 2016-08-24 | Disposition: A | Discharge status: Home / Self Care | Payer: BC Managed Care – PPO | Source: Ambulatory Visit | Attending: Certified Nurse Midwife | Admitting: Certified Nurse Midwife

## 2016-08-24 DIAGNOSIS — R921 Mammographic calcification found on diagnostic imaging of breast: Secondary | ICD-10-CM

## 2016-12-08 ENCOUNTER — Other Ambulatory Visit: Payer: Self-pay | Admitting: Certified Nurse Midwife

## 2016-12-08 DIAGNOSIS — R921 Mammographic calcification found on diagnostic imaging of breast: Secondary | ICD-10-CM

## 2017-01-09 ENCOUNTER — Encounter: Payer: Self-pay | Admitting: Certified Nurse Midwife

## 2017-01-09 ENCOUNTER — Ambulatory Visit (INDEPENDENT_AMBULATORY_CARE_PROVIDER_SITE_OTHER): Payer: BC Managed Care – PPO | Admitting: Certified Nurse Midwife

## 2017-01-09 VITALS — BP 134/86 | HR 60 | Ht 60.5 in | Wt 166.0 lb

## 2017-01-09 DIAGNOSIS — E559 Vitamin D deficiency, unspecified: Secondary | ICD-10-CM | POA: Diagnosis not present

## 2017-01-09 DIAGNOSIS — Z1211 Encounter for screening for malignant neoplasm of colon: Secondary | ICD-10-CM | POA: Diagnosis not present

## 2017-01-09 DIAGNOSIS — Z01419 Encounter for gynecological examination (general) (routine) without abnormal findings: Secondary | ICD-10-CM

## 2017-01-09 NOTE — Patient Instructions (Signed)

## 2017-01-09 NOTE — Progress Notes (Signed)
51 y.o. G0P0000 Single  African American Fe here for annual exam. Denies vaginal dryness or bleeding. Occasional hot flashes, no issues. No insomnia issues. Had a concerning visit with new PCP, at [facility], does not want to go back. She was noted to have elevated BP and was placed on medication and is taking and checking BP also. Had full work up for heart issues also. Had labs done also. Requests another PCP recommendation. Colonoscopy due requests referral. No other health issues today.  Patient's last menstrual period was 07/12/1999 (exact date).          Sexually active: No.  The current method of family planning is none.    Exercising: Yes.    walking Smoker:  no  Health Maintenance: Pap: 12/04/08, Negative History of Abnormal Pap: yes MMG: 01/12/16, with biopsy on 01/26/16 with Fibroadenoma with calcifications: 08/24/16, 3D-yes, Density Category C, Bi-Rads 3: probably benign, follow up on 01/12/17 Self Breast exams: yes Colonoscopy: due BMD: not due TDaP: 12/19/11 Shingles: None Pneumonia: None Hep C and HIV: yrs ago Labs: yes    reports that she has never smoked. She has never used smokeless tobacco. She reports that she does not drink alcohol or use drugs.  Past Medical History:  Diagnosis Date  . Fibroid   . Leiomyoma     Past Surgical History:  Procedure Laterality Date  . ABDOMINAL HYSTERECTOMY     TAH/LOA  . CHOLECYSTECTOMY    . HYSTEROSCOPY  4/00   fibroid hysteroscopic resection  . MYOMECTOMY  7/96   multiple  . ROBOTIC ASSISTED LAPAROSCOPIC VAGINAL HYSTERECTOMY WITH FIBROID REMOVAL      Current Outpatient Prescriptions  Medication Sig Dispense Refill  . hydrochlorothiazide (MICROZIDE) 12.5 MG capsule Take 12.5 mg by mouth daily.    Marland Kitchen Lifitegrast (XIIDRA) 5 % SOLN Apply to eye.    . Multiple Vitamins-Minerals (MULTIVITAMIN PO) Take by mouth. occ     No current facility-administered medications for this visit.     Family History  Problem Relation Age of Onset   . Hypertension Mother   . Hypertension Father   . Diabetes Paternal Aunt   . Cancer Paternal Uncle        bone  . Cancer Paternal Grandmother        bone    ROS:  Pertinent items are noted in HPI.  Otherwise, a comprehensive ROS was negative.  Exam:   BP 134/86 (BP Location: Right Arm, Patient Position: Sitting, Cuff Size: Large)   Pulse 60   Ht 5' 0.5" (1.537 m)   Wt 166 lb (75.3 kg)   LMP 07/12/1999 (Exact Date)   BMI 31.89 kg/m  Height: 5' 0.5" (153.7 cm) Ht Readings from Last 3 Encounters:  01/09/17 5' 0.5" (1.537 m)  01/06/16 5' 0.25" (1.53 m)  12/29/14 5' (1.524 m)    General appearance: alert, cooperative and appears stated age Head: Normocephalic, without obvious abnormality, atraumatic Neck: no adenopathy, supple, symmetrical, trachea midline and thyroid normal to inspection and palpation Lungs: clear to auscultation bilaterally Breasts: normal appearance, no masses or tenderness, No nipple retraction or dimpling, No nipple discharge or bleeding, No axillary or supraclavicular adenopathy Heart: regular rate and rhythm Abdomen: soft, non-tender; no masses,  no organomegaly Extremities: extremities normal, atraumatic, no cyanosis or edema Skin: Skin color, texture, turgor normal. No rashes or lesions Lymph nodes: Cervical, supraclavicular, and axillary nodes normal. No abnormal inguinal nodes palpated Neurologic: Grossly normal   Pelvic: External genitalia:  no lesions  Urethra:  normal appearing urethra with no masses, tenderness or lesions              Bartholin's and Skene's: normal                 Vagina: normal appearing vagina with normal color and discharge, no lesions              Cervix: absent              Pap taken: No. Bimanual Exam:  Uterus:  uterus absent              Adnexa: normal adnexa, no mass, fullness, tenderness and left adnexa surgically absent               Rectovaginal: Confirms               Anus:  normal sphincter tone,  no lesions  Chaperone present: yes  A:  Well Woman with normal exam  Menopausal no HRT s/p LAVH with left LOA, fibroid removal  New diagnosis of hypertension on Microzide and tolerating well, needs PCP follow up referral  Colonoscopy due  Screening lab    P:   Reviewed health and wellness pertinent to exam  Discussed risk of uncontrolled hypertension and need for follow up . Recommended seeing same MD, patient refuses to go back and ask for referral. She will be called with information regarding referral.  Risks and benefits of colonoscopy given. Requests referral to Dr. Collene Mares. She will be called with referral information.  Lab: Vitamin D  Pap smear: no  counseled on breast self exam, mammography screening, menopause, adequate intake of calcium and vitamin D, diet and exercise  return annually or prn  An After Visit Summary was printed and given to the patient.

## 2017-01-10 LAB — VITAMIN D 25 HYDROXY (VIT D DEFICIENCY, FRACTURES): Vit D, 25-Hydroxy: 17.6 ng/mL — ABNORMAL LOW (ref 30.0–100.0)

## 2017-01-12 ENCOUNTER — Other Ambulatory Visit: Payer: Self-pay | Admitting: Certified Nurse Midwife

## 2017-01-12 ENCOUNTER — Ambulatory Visit
Admission: RE | Admit: 2017-01-12 | Discharge: 2017-01-12 | Disposition: A | Discharge status: Home / Self Care | Payer: BC Managed Care – PPO | Source: Ambulatory Visit | Attending: Certified Nurse Midwife | Admitting: Certified Nurse Midwife

## 2017-01-12 DIAGNOSIS — R921 Mammographic calcification found on diagnostic imaging of breast: Secondary | ICD-10-CM

## 2017-01-15 ENCOUNTER — Ambulatory Visit: Payer: BC Managed Care – PPO

## 2017-01-15 MED ORDER — VITAMIN D (ERGOCALCIFEROL) 1.25 MG (50000 UNIT) PO CAPS: 50000.0000 [IU] | ORAL_CAPSULE | ORAL | 0 refills | Status: AC

## 2017-01-15 NOTE — Addendum Note (Signed)
Addended by: Graylon Good on: 01/15/2017 03:25 PM   Modules accepted: Orders

## 2017-01-16 ENCOUNTER — Ambulatory Visit
Admission: RE | Admit: 2017-01-16 | Discharge: 2017-01-16 | Disposition: A | Discharge status: Home / Self Care | Payer: BC Managed Care – PPO | Source: Ambulatory Visit | Attending: Certified Nurse Midwife | Admitting: Certified Nurse Midwife

## 2017-01-16 DIAGNOSIS — R921 Mammographic calcification found on diagnostic imaging of breast: Secondary | ICD-10-CM

## 2017-03-15 ENCOUNTER — Other Ambulatory Visit: Payer: BC Managed Care – PPO

## 2017-03-15 ENCOUNTER — Other Ambulatory Visit: Payer: Self-pay

## 2017-03-15 DIAGNOSIS — E559 Vitamin D deficiency, unspecified: Secondary | ICD-10-CM

## 2017-03-16 LAB — VITAMIN D 25 HYDROXY (VIT D DEFICIENCY, FRACTURES): VIT D 25 HYDROXY: 36.7 ng/mL (ref 30.0–100.0)

## 2017-03-18 ENCOUNTER — Other Ambulatory Visit: Payer: Self-pay | Admitting: Certified Nurse Midwife

## 2017-03-19 NOTE — Telephone Encounter (Signed)
Medication refill request: Vitamin D Last AEX:  01/09/17 DL Next AEX: 01/11/18  Last MMG (if hormonal medication request): 01/16/17 BIRADS3:Probably benign  Refill authorized: 01/15/17 #[id number].   Notes recorded by Regina Eck, CNM on 03/16/2017 at 5:08 PM EDT Notify patient that Vitamin D has improved can now do Vitamin D3 1000 IU daily to maintain and recheck with next aex

## 2017-04-01 ENCOUNTER — Other Ambulatory Visit: Payer: Self-pay | Admitting: Certified Nurse Midwife

## 2017-04-06 ENCOUNTER — Encounter: Payer: Self-pay | Admitting: Family Medicine

## 2017-04-06 ENCOUNTER — Ambulatory Visit (INDEPENDENT_AMBULATORY_CARE_PROVIDER_SITE_OTHER): Payer: BC Managed Care – PPO | Admitting: Family Medicine

## 2017-04-06 VITALS — BP 150/100 | HR 55 | Temp 97.6°F | Ht 60.0 in | Wt 168.4 lb

## 2017-04-06 DIAGNOSIS — Z13220 Encounter for screening for lipoid disorders: Secondary | ICD-10-CM

## 2017-04-06 DIAGNOSIS — E559 Vitamin D deficiency, unspecified: Secondary | ICD-10-CM | POA: Insufficient documentation

## 2017-04-06 DIAGNOSIS — D509 Iron deficiency anemia, unspecified: Secondary | ICD-10-CM

## 2017-04-06 DIAGNOSIS — Z131 Encounter for screening for diabetes mellitus: Secondary | ICD-10-CM

## 2017-04-06 DIAGNOSIS — Z23 Encounter for immunization: Secondary | ICD-10-CM | POA: Diagnosis not present

## 2017-04-06 DIAGNOSIS — I10 Essential (primary) hypertension: Secondary | ICD-10-CM | POA: Diagnosis not present

## 2017-04-06 DIAGNOSIS — Z0001 Encounter for general adult medical examination with abnormal findings: Secondary | ICD-10-CM

## 2017-04-06 LAB — COMPREHENSIVE METABOLIC PANEL
ALT: 11 U/L (ref 0–35)
AST: 15 U/L (ref 0–37)
Albumin: 4.2 g/dL (ref 3.5–5.2)
Alkaline Phosphatase: 46 U/L (ref 39–117)
BUN: 11 mg/dL (ref 6–23)
CALCIUM: 9.8 mg/dL (ref 8.4–10.5)
CHLORIDE: 103 meq/L (ref 96–112)
CO2: 32 meq/L (ref 19–32)
CREATININE: 0.74 mg/dL (ref 0.40–1.20)
GFR: 105.88 mL/min (ref >60.00)
Glucose, Bld: 91 mg/dL (ref 70–99)
Potassium: 3.8 mEq/L (ref 3.5–5.1)
SODIUM: 141 meq/L (ref 135–145)
Total Bilirubin: 0.4 mg/dL (ref 0.2–1.2)
Total Protein: 7 g/dL (ref 6.0–8.3)

## 2017-04-06 LAB — CBC WITH DIFFERENTIAL/PLATELET
BASOS ABS: 0 10*3/uL (ref 0.0–0.1)
Basophils Relative: 1 % (ref 0.0–3.0)
Eosinophils Absolute: 0.2 10*3/uL (ref 0.0–0.7)
Eosinophils Relative: 4 % (ref 0.0–5.0)
HEMATOCRIT: 40.2 % (ref 36.0–46.0)
Hemoglobin: 12.8 g/dL (ref 12.0–15.0)
LYMPHS ABS: 1.7 10*3/uL (ref 0.7–4.0)
LYMPHS PCT: 39.6 % (ref 12.0–46.0)
MCHC: 31.9 g/dL (ref 30.0–36.0)
MCV: 81.3 fl (ref 78.0–100.0)
MONO ABS: 0.5 10*3/uL (ref 0.1–1.0)
MONOS PCT: 10.7 % (ref 3.0–12.0)
NEUTROS PCT: 44.7 % (ref 43.0–77.0)
Neutro Abs: 1.9 10*3/uL (ref 1.4–7.7)
PLATELETS: 195 10*3/uL (ref 150.0–400.0)
RBC: 4.95 Mil/uL (ref 3.87–5.11)
RDW: 14.3 % (ref 11.5–15.5)
WBC: 4.3 10*3/uL (ref 4.0–10.5)

## 2017-04-06 LAB — LIPID PANEL
Cholesterol: 195 mg/dL (ref 0–200)
HDL: 60.3 mg/dL (ref >39.00)
LDL Cholesterol: 109 mg/dL — ABNORMAL HIGH (ref 0–99)
NONHDL: 134.56
TRIGLYCERIDES: 130 mg/dL (ref 0.0–149.0)
Total CHOL/HDL Ratio: 3
VLDL: 26 mg/dL (ref 0.0–40.0)

## 2017-04-06 LAB — HEMOGLOBIN A1C: Hgb A1c MFr Bld: 6.2 % (ref 4.6–6.5)

## 2017-04-06 MED ORDER — LISINOPRIL 10 MG PO TABS: 10.0000 mg | ORAL_TABLET | Freq: Every day | ORAL | 5 refills | Status: DC

## 2017-04-06 NOTE — Patient Instructions (Addendum)
Please don't for get to purchase a blood pressure cuff so that you can check your blood pressure at home.  When you start checking your blood pressure, write down the numbers and bring them with you to your next visit(s).  We have ordered labs at this visit. It can take up to 1-2 weeks for results and processing. If results require follow up or explanation, we will call you with instructions. Clinically stable results will be released to your [facility] or sent to you via mail. If you have not heard from [place] or cannot find your results in [facility] in 2 weeks please contact our office at [phone].     DASH Eating Plan DASH stands for "Dietary Approaches to Stop Hypertension." The DASH eating plan is a healthy eating plan that has been shown to reduce high blood pressure (hypertension). It may also reduce your risk for type 2 diabetes, heart disease, and stroke. The DASH eating plan may also help with weight loss. What are tips for following this plan? General guidelines  Avoid eating more than 2,300 mg (milligrams) of salt (sodium) a day. If you have hypertension, you may need to reduce your sodium intake to 1,500 mg a day.  Limit alcohol intake to no more than 1 drink a day for nonpregnant women and 2 drinks a day for men. One drink equals 12 oz of beer, 5 oz of wine, or 1 oz of hard liquor.  Work with your health care provider to maintain a healthy body weight or to lose weight. Ask what an ideal weight is for you.  Get at least 30 minutes of exercise that causes your heart to beat faster (aerobic exercise) most days of the week. Activities may include walking, swimming, or biking.  Work with your health care provider or diet and nutrition specialist (dietitian) to adjust your eating plan to your individual calorie needs. Reading food labels  Check food labels for the amount of sodium per serving. Choose foods with less than 5 percent of the Daily Value of sodium. Generally, foods with less  than 300 mg of sodium per serving fit into this eating plan.  To find whole grains, look for the word "whole" as the first word in the ingredient list. Shopping  Buy products labeled as "low-sodium" or "no salt added."  Buy fresh foods. Avoid canned foods and premade or frozen meals. Cooking  Avoid adding salt when cooking. Use salt-free seasonings or herbs instead of table salt or sea salt. Check with your health care provider or pharmacist before using salt substitutes.  Do not fry foods. Cook foods using healthy methods such as baking, boiling, grilling, and broiling instead.  Cook with heart-healthy oils, such as olive, canola, soybean, or sunflower oil. Meal planning   Eat a balanced diet that includes: ? 5 or more servings of fruits and vegetables each day. At each meal, try to fill half of your plate with fruits and vegetables. ? Up to 6-8 servings of whole grains each day. ? Less than 6 oz of lean meat, poultry, or fish each day. A 3-oz serving of meat is about the same size as a deck of cards. One egg equals 1 oz. ? 2 servings of low-fat dairy each day. ? A serving of nuts, seeds, or beans 5 times each week. ? Heart-healthy fats. Healthy fats called Omega-3 fatty acids are found in foods such as flaxseeds and coldwater fish, like sardines, salmon, and mackerel.  Limit how much you eat of  the following: ? Canned or prepackaged foods. ? Food that is high in trans fat, such as fried foods. ? Food that is high in saturated fat, such as fatty meat. ? Sweets, desserts, sugary drinks, and other foods with added sugar. ? Full-fat dairy products.  Do not salt foods before eating.  Try to eat at least 2 vegetarian meals each week.  Eat more home-cooked food and less restaurant, buffet, and fast food.  When eating at a restaurant, ask that your food be prepared with less salt or no salt, if possible. What foods are recommended? The items listed may not be a complete list. Talk  with your dietitian about what dietary choices are best for you. Grains Whole-grain or whole-wheat bread. Whole-grain or whole-wheat pasta. Brown rice. [place]. Bulgur. Whole-grain and low-sodium cereals. Pita bread. Low-fat, low-sodium crackers. Whole-wheat flour tortillas. Vegetables Fresh or frozen vegetables (raw, steamed, roasted, or grilled). Low-sodium or reduced-sodium tomato and vegetable juice. Low-sodium or reduced-sodium tomato sauce and tomato paste. Low-sodium or reduced-sodium canned vegetables. Fruits All fresh, dried, or frozen fruit. Canned fruit in natural juice (without added sugar). Meat and other protein foods Skinless chicken or [place]. Ground chicken or [place]. Pork with fat trimmed off. Fish and seafood. Egg whites. Dried beans, peas, or lentils. Unsalted nuts, nut butters, and seeds. Unsalted canned beans. Lean cuts of beef with fat trimmed off. Low-sodium, lean deli meat. Dairy Low-fat (1%) or fat-free (skim) milk. Fat-free, low-fat, or reduced-fat cheeses. Nonfat, low-sodium ricotta or cottage cheese. Low-fat or nonfat yogurt. Low-fat, low-sodium cheese. Fats and oils Soft margarine without trans fats. Vegetable oil. Low-fat, reduced-fat, or light mayonnaise and salad dressings (reduced-sodium). Canola, safflower, olive, soybean, and sunflower oils. Avocado. Seasoning and other foods Herbs. Spices. Seasoning mixes without salt. Unsalted popcorn and pretzels. Fat-free sweets. What foods are not recommended? The items listed may not be a complete list. Talk with your dietitian about what dietary choices are best for you. Grains Baked goods made with fat, such as croissants, muffins, or some breads. Dry pasta or rice meal packs. Vegetables Creamed or fried vegetables. Vegetables in a cheese sauce. Regular canned vegetables (not low-sodium or reduced-sodium). Regular canned tomato sauce and paste (not low-sodium or reduced-sodium). Regular tomato and vegetable  juice (not low-sodium or reduced-sodium). Angie Fava. Olives. Fruits Canned fruit in a light or heavy syrup. Fried fruit. Fruit in cream or butter sauce. Meat and other protein foods Fatty cuts of meat. Ribs. Fried meat. Berniece Salines. Sausage. Bologna and other processed lunch meats. Salami. Fatback. Hotdogs. Bratwurst. Salted nuts and seeds. Canned beans with added salt. Canned or smoked fish. Whole eggs or egg yolks. Chicken or [place] with skin. Dairy Whole or 2% milk, cream, and half-and-half. Whole or full-fat cream cheese. Whole-fat or sweetened yogurt. Full-fat cheese. Nondairy creamers. Whipped toppings. Processed cheese and cheese spreads. Fats and oils Butter. Stick margarine. Lard. Shortening. Ghee. Bacon fat. Tropical oils, such as coconut, palm kernel, or palm oil. Seasoning and other foods Salted popcorn and pretzels. Onion salt, garlic salt, seasoned salt, table salt, and sea salt. Worcestershire sauce. Tartar sauce. Barbecue sauce. Teriyaki sauce. Soy sauce, including reduced-sodium. Steak sauce. Canned and packaged gravies. Fish sauce. Oyster sauce. Cocktail sauce. Horseradish that you find on the shelf. Ketchup. Mustard. Meat flavorings and tenderizers. Bouillon cubes. Hot sauce and Tabasco sauce. Premade or packaged marinades. Premade or packaged taco seasonings. Relishes. Regular salad dressings. Where to find more information:  National Heart, Lung, and [place]: https://wilson-eaton.com/  American Heart Association:  www.heart.org Summary  The DASH eating plan is a healthy eating plan that has been shown to reduce high blood pressure (hypertension). It may also reduce your risk for type 2 diabetes, heart disease, and stroke.  With the DASH eating plan, you should limit salt (sodium) intake to 2,300 mg a day. If you have hypertension, you may need to reduce your sodium intake to 1,500 mg a day.  When on the DASH eating plan, aim to eat more fresh fruits and vegetables, whole grains,  lean proteins, low-fat dairy, and heart-healthy fats.  Work with your health care provider or diet and nutrition specialist (dietitian) to adjust your eating plan to your individual calorie needs. This information is not intended to replace advice given to you by your health care provider. Make sure you discuss any questions you have with your health care provider. Document Released: 05/18/2011 Document Revised: 05/22/2016 Document Reviewed: 05/22/2016 Elsevier Interactive Patient Education  2017 Elsevier Inc.  Hypertension Hypertension is another name for high blood pressure. High blood pressure forces your heart to work harder to pump blood. This can cause problems over time. There are two numbers in a blood pressure reading. There is a top number (systolic) over a bottom number (diastolic). It is best to have a blood pressure below 120/80. Healthy choices can help lower your blood pressure. You may need medicine to help lower your blood pressure if:  Your blood pressure cannot be lowered with healthy choices.  Your blood pressure is higher than 130/80.  Follow these instructions at home: Eating and drinking  If directed, follow the DASH eating plan. This diet includes: ? Filling half of your plate at each meal with fruits and vegetables. ? Filling one quarter of your plate at each meal with whole grains. Whole grains include whole wheat pasta, brown rice, and whole grain bread. ? Eating or drinking low-fat dairy products, such as skim milk or low-fat yogurt. ? Filling one quarter of your plate at each meal with low-fat (lean) proteins. Low-fat proteins include fish, skinless chicken, eggs, beans, and tofu. ? Avoiding fatty meat, cured and processed meat, or chicken with skin. ? Avoiding premade or processed food.  Eat less than 1,500 mg of salt (sodium) a day.  Limit alcohol use to no more than 1 drink a day for nonpregnant women and 2 drinks a day for men. One drink equals 12 oz of  beer, 5 oz of wine, or 1 oz of hard liquor. Lifestyle  Work with your doctor to stay at a healthy weight or to lose weight. Ask your doctor what the best weight is for you.  Get at least 30 minutes of exercise that causes your heart to beat faster (aerobic exercise) most days of the week. This may include walking, swimming, or biking.  Get at least 30 minutes of exercise that strengthens your muscles (resistance exercise) at least 3 days a week. This may include lifting weights or pilates.  Do not use any products that contain nicotine or tobacco. This includes cigarettes and e-cigarettes. If you need help quitting, ask your doctor.  Check your blood pressure at home as told by your doctor.  Keep all follow-up visits as told by your doctor. This is important. Medicines  Take over-the-counter and prescription medicines only as told by your doctor. Follow directions carefully.  Do not skip doses of blood pressure medicine. The medicine does not work as well if you skip doses. Skipping doses also puts you at risk for problems.  Ask your doctor about side effects or reactions to medicines that you should watch for. Contact a doctor if:  You think you are having a reaction to the medicine you are taking.  You have headaches that keep coming back (recurring).  You feel dizzy.  You have swelling in your ankles.  You have trouble with your vision. Get help right away if:  You get a very bad headache.  You start to feel confused.  You feel weak or numb.  You feel faint.  You get very bad pain in your: ? Chest. ? Belly (abdomen).  You throw up (vomit) more than once.  You have trouble breathing. Summary  Hypertension is another name for high blood pressure.  Making healthy choices can help lower blood pressure. If your blood pressure cannot be controlled with healthy choices, you may need to take medicine. This information is not intended to replace advice given to you by  your health care provider. Make sure you discuss any questions you have with your health care provider. Document Released: 11/15/2007 Document Revised: 04/26/2016 Document Reviewed: 04/26/2016 Elsevier Interactive Patient Education  2018 [employer].  Iron Deficiency Anemia, Adult Iron-deficiency anemia is when you have a low amount of red blood cells or hemoglobin. This happens because you have too little iron in your body. Hemoglobin carries oxygen to parts of the body. Anemia can cause your body to not get enough oxygen. It may or may not cause symptoms. Follow these instructions at home: Medicines  Take over-the-counter and prescription medicines only as told by your doctor. This includes iron pills (supplements) and vitamins.  If you cannot handle taking iron pills by mouth, ask your doctor about getting iron through: ? A vein (intravenously). ? A shot (injection) into a muscle.  Take iron pills when your stomach is empty. If you cannot handle this, take them with food.  Do not drink milk or take antacids at the same time as your iron pills.  To prevent trouble pooping (constipation), eat fiber or take medicine (stool softener) as told by your doctor. Eating and drinking  Talk with your doctor before changing the foods you eat. He or she may tell you to eat foods that have a lot of iron, such as: ? Liver. ? Lowfat (lean) beef. ? Breads and cereals that have iron added to them (fortified breads and cereals). ? Eggs. ? Dried fruit. ? Dark green, leafy vegetables.  Drink enough fluid to keep your pee (urine) clear or pale yellow.  Eat fresh fruits and vegetables that are high in vitamin C. They help your body to use iron. Foods with a lot of vitamin C include: ? Oranges. ? Peppers. ? Tomatoes. ? Mangoes. General instructions  Return to your normal activities as told by your doctor. Ask your doctor what activities are safe for you.  Keep yourself clean, and keep things  clean around you (your surroundings). Anemia can make you get sick more easily.  Keep all follow-up visits as told by your doctor. This is important. Contact a doctor if:  You feel sick to your stomach (nauseous).  You throw up (vomit).  You feel weak.  You are sweating for no clear reason.  You have trouble pooping, such as: ? Pooping (having a bowel movement) less than 3 times a week. ? Straining to poop. ? Having poop that is hard, dry, or larger than normal. ? Feeling full or bloated. ? Pain in the lower belly. ? Not feeling better  after pooping. Get help right away if:  You pass out (faint). If this happens, do not drive yourself to the hospital. Call your local emergency services (911 in the [place]  You have chest pain.  You have shortness of breath that: ? Is very bad. ? Gets worse with physical activity.  You have a fast heartbeat.  You get light-headed when getting up from sitting or lying down. This information is not intended to replace advice given to you by your health care provider. Make sure you discuss any questions you have with your health care provider. Document Released: 07/01/2010 Document Revised: 02/16/2016 Document Reviewed: 02/16/2016 Elsevier Interactive Patient Education  2017 [employer].

## 2017-04-06 NOTE — Progress Notes (Signed)
Patient presents to clinic today to establish care.  SUBJECTIVE: PMH:  Pt is a 51 yo AAF with pmh sig for HTN and iron deficiency anemia.  Pt was formerly seen by a provider in [place], [place].  Pt is also followed by OB/GYN.  HTN: -started on HCTZ 12.5 mg in July -endorses urinary frequency since taking. -Denies HA, CP, changes in vision -forgot to take med today.  Vitamin D deficiency: -Diagnosed in July -Has been taking ergocalciferol 50,000 units weekly. Is on her last week of pills -Recent vitamin D recheck 36.7 on 03/15/17 -Patient asking about starting vitamin D 1000 international units daily. 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 Patient denies tobacco, alcohol, drug use. LMP December 2000.  Endorses increased stress at work.  Family medical history: Mom-alive-thyroid problems Dad-alive, HLD, HTN Sister, Andrea-AAW Sister, Monica-AW Sister, Cathie Beams, HTN  Health Maintenance: Dental --Dr. Jerolyn Center Vision --Dr. Truman Hayward Immunizations -- Colonoscopy --02/16/2017 Mammogram --several this year secondary to increased breast density PAP -- 2001    Past Medical History:  Diagnosis Date  . Fibroid   . Hypertension   . Leiomyoma     Past Surgical History:  Procedure Laterality Date  . ABDOMINAL HYSTERECTOMY     TAH/LOA  . CHOLECYSTECTOMY    . COLONOSCOPY    . HYSTEROSCOPY  4/00   fibroid hysteroscopic resection  . MYOMECTOMY  7/96   multiple  . ROBOTIC ASSISTED LAPAROSCOPIC VAGINAL HYSTERECTOMY WITH FIBROID REMOVAL      Current Outpatient Prescriptions on File Prior to Visit  Medication Sig Dispense Refill  . hydrochlorothiazide (MICROZIDE) 12.5 MG capsule Take 12.5 mg by  mouth daily.    Marland Kitchen Lifitegrast (XIIDRA) 5 % SOLN Apply to eye.    . Multiple Vitamins-Minerals (MULTIVITAMIN PO) Take by mouth. occ     No current facility-administered medications on file prior to visit.     Allergies  Allergen Reactions  . Fish Allergy   . Strawberry (Diagnostic)   . Tamiflu [Oseltamivir] Hives    Family History  Problem Relation Age of Onset  . Hypertension Mother   . Hypertension Father   . Diabetes Paternal Aunt   . Cancer Paternal Uncle        bone  . Cancer Paternal Grandmother        bone    Social History   Social History  . Marital status: Single    Spouse name: N/A  . Number of children: N/A  . Years of education: N/A   Occupational History  . Not on file.   Social History Main Topics  . Smoking status: Never Smoker  . Smokeless tobacco: Never Used  . Alcohol use No  . Drug use: No  . Sexual activity: No     Comment: hysterectomy   Other Topics Concern  . Not on file   Social History Narrative  . No narrative on file    ROS General: Denies fever, chills, night sweats, changes in weight, changes in appetite HEENT: Denies headaches, ear pain, changes in vision, rhinorrhea, sore throat CV: Denies CP, palpitations, SOB, orthopnea Pulm: Denies SOB, cough, wheezing GI: Denies abdominal pain, nausea, vomiting, diarrhea, constipation GU: Denies dysuria, hematuria, frequency, vaginal discharge Msk: Denies muscle cramps, joint pains Neuro: Denies weakness, numbness,  tingling Skin: Denies rashes, bruising Psych: Denies depression, anxiety, hallucinations  BP (!) 150/100 (BP Location: Right Arm, Patient Position: Sitting, Cuff Size: Normal)   Pulse (!) 55   Temp 97.6 F (36.4 C) (Oral)   Ht 5' (1.524 m)   Wt 168 lb 6.4 oz (76.4 kg)   LMP 07/12/1999 (Exact Date)   BMI 32.89 kg/m   Physical Exam Gen. Pleasant, well developed, well-nourished, in NAD HEENT - [place]/AT, PERRL, arcus senilis, no scleral icterus, no nasal drainage,  pharynx without erythema or exudate. Neck: No JVD, no thyromegaly, no carotid bruits Lungs: no accessory muscle use, CTAB, no wheezes, rales or rhonchi Cardiovascular: RRR, No r/g/m, no peripheral edema Abdomen: BS present, soft, nontender,nondistended, no hepatosplenomegaly Musculoskeletal: No deformities, moves all four extremities, no cyanosis or clubbing, normal tone Neuro:  A&Ox3, CN II-XII intact, normal gait Skin:  Warm, dry, intact, no lesions Psych: normal affect, mood appropriate   Assessment/Plan: Essential hypertension  -BP uncontrolled 150/100 -Will d/c HCTZ as pt does not like increase in urination. -Discussed need for lifestyle modifications -Given handout on DASH diet, ways to control HTN -Patient advised to purchase BP cuff for at home monitoring. Advised to keep a log to bring to each visit -Will obtain labs and start lisinopril -F/U in 1 week - Plan: Comprehensive metabolic panel, lisinopril (PRINIVIL,ZESTRIL) 10 MG tablet  Need for influenza vaccination  - Plan: Flu Vaccine QUAD 6+ mos PF IM (Fluarix Quad PF)  Screening cholesterol level  - Plan: Lipid panel  Vitamin D deficiency -Continue ergocalciferol 50,000 IU -Advised once finished, can check current MVI for amount of vitamin D. If at least 1000 IU, can continue taking MVI daily as opposed to purchasing vitamin D pills  Iron deficiency anemia, unspecified iron deficiency anemia type  -Advised may need to restart daily iron therapy -Will obtain labs - Plan: CBC with Differential/Platelet  Encounter for well adult exam with abnormal findings -We reviewed the PMH, PSH, FH, SH, Meds and Allergies. -We provided refills for any medications we will prescribe as needed. -We addressed current concerns per orders and patient instructions. -We have asked for records for pertinent exams, studies, vaccines and notes from previous providers. -We have advised patient to follow up per instructions below.   F/u in  1 wk for bp Screening for diabetes mellitus - Plan: Hemoglobin A1c     F/u in 1 wk for bp

## 2017-04-12 ENCOUNTER — Other Ambulatory Visit: Payer: Self-pay | Admitting: Family Medicine

## 2017-04-12 MED ORDER — LISINOPRIL 20 MG PO TABS: 20.0000 mg | ORAL_TABLET | Freq: Every day | ORAL | 3 refills | Status: DC

## 2017-04-17 ENCOUNTER — Encounter: Payer: Self-pay | Admitting: Emergency Medicine

## 2017-06-08 ENCOUNTER — Encounter: Payer: Self-pay | Admitting: Adult Health

## 2017-06-08 ENCOUNTER — Ambulatory Visit: Payer: BC Managed Care – PPO | Admitting: Adult Health

## 2017-06-08 VITALS — BP 144/90 | Temp 98.1°F | Wt 170.0 lb

## 2017-06-08 DIAGNOSIS — S0501XA Injury of conjunctiva and corneal abrasion without foreign body, right eye, initial encounter: Secondary | ICD-10-CM

## 2017-06-08 MED ORDER — ERYTHROMYCIN 5 MG/GM OP OINT: TOPICAL_OINTMENT | Freq: Four times a day (QID) | OPHTHALMIC | 0 refills | Status: AC

## 2017-06-08 NOTE — Progress Notes (Signed)
Subjective:    Patient ID: Cindy Zimmerman, female    DOB: [birth year], 51 y.o.   MRN: [id number]  HPI  51 year old female who  has a past medical history of Fibroid, Hypertension, and Leiomyoma. She presents to the office today for watery right eye. She has been seen by her eye doctor and was prescribed Xiidra for dry eye syndrome. She has been using but has not noticed results. She does report a "sting sensation" and has been rubbing her eyes frequently. Drainage is watery, has not noticed any purulent drainage.   Denies any trauma or blurred vision. Has mild sensitivity to light     Review of Systems See HPI   Past Medical History:  Diagnosis Date  . Fibroid   . Hypertension   . Leiomyoma     Social History   Socioeconomic History  . Marital status: Single    Spouse name: Not on file  . Number of children: Not on file  . Years of education: Not on file  . Highest education level: Not on file  Social Needs  . Financial resource strain: Not on file  . Food insecurity - worry: Not on file  . Food insecurity - inability: Not on file  . Transportation needs - medical: Not on file  . Transportation needs - non-medical: Not on file  Occupational History  . Not on file  Tobacco Use  . Smoking status: Never Smoker  . Smokeless tobacco: Never Used  Substance and Sexual Activity  . Alcohol use: No    Alcohol/week: 0.0 oz  . Drug use: No  . Sexual activity: No    Partners: Male    Birth control/protection: Surgical    Comment: hysterectomy  Other Topics Concern  . Not on file  Social History Narrative  . Not on file    Past Surgical History:  Procedure Laterality Date  . ABDOMINAL HYSTERECTOMY     TAH/LOA  . CHOLECYSTECTOMY    . COLONOSCOPY    . HYSTEROSCOPY  4/00   fibroid hysteroscopic resection  . MYOMECTOMY  7/96   multiple  . ROBOTIC ASSISTED LAPAROSCOPIC VAGINAL HYSTERECTOMY WITH FIBROID REMOVAL      Family History  Problem Relation Age of Onset  .  Hypertension Mother   . Hypertension Father   . Diabetes Paternal Aunt   . Cancer Paternal Uncle        bone  . Cancer Paternal Grandmother        bone    Allergies  Allergen Reactions  . Fish Allergy   . Strawberry (Diagnostic)   . Tamiflu [Oseltamivir] Hives    Current Outpatient Medications on File Prior to Visit  Medication Sig Dispense Refill  . Lifitegrast (XIIDRA) 5 % SOLN Apply to eye.    Marland Kitchen lisinopril (PRINIVIL,ZESTRIL) 20 MG tablet Take 1 tablet (20 mg total) by mouth daily. 30 tablet 3  . Multiple Vitamins-Minerals (MULTIVITAMIN PO) Take by mouth. occ     No current facility-administered medications on file prior to visit.     BP (!) 144/90 (BP Location: Left Arm)   Temp 98.1 F (36.7 C) (Oral)   Wt 170 lb (77.1 kg)   LMP 07/12/1999 (Exact Date)   BMI 33.20 kg/m       Objective:   Physical Exam  Constitutional: She is oriented to person, place, and time. She appears well-developed and well-nourished. No distress.  Eyes: Conjunctivae and EOM are normal. Pupils are equal, round, and reactive to  light. Right eye exhibits discharge. No foreign body present in the right eye.  Slit lamp exam:      The right eye shows corneal abrasion and fluorescein uptake.  Cardiovascular: Regular rhythm.  Neurological: She is alert and oriented to person, place, and time.  Skin: Skin is warm and dry. No rash noted. She is not diaphoretic. No erythema. No pallor.  Psychiatric: She has a normal mood and affect. Her behavior is normal. Judgment and thought content normal.  Vitals reviewed.     Assessment & Plan:  1. Abrasion of right cornea, initial encounter - erythromycin (ROMYCIN) ophthalmic ointment; Place into the right eye 4 (four) times daily for 5 days.  Dispense: 1 g; Refill: 0 - Follow up if no improvement in the next 2-3 days   Dorothyann Peng, NP

## 2017-08-01 ENCOUNTER — Telehealth: Payer: Self-pay | Admitting: Family Medicine

## 2017-08-01 MED ORDER — LISINOPRIL 20 MG PO TABS
20.0000 mg | ORAL_TABLET | Freq: Every day | ORAL | 2 refills | Status: DC
Start: 2017-08-01 — End: 2017-11-09

## 2017-08-01 NOTE — Telephone Encounter (Signed)
Copied from [place]. Topic: General - Other >> Aug 01, 2017 10:02 AM Darl Householder, RMA wrote: Reason for CRM: Medication refill request for lisinopril (PRINIVIL,ZESTRIL) 20 MG to be sent to [facility]

## 2017-10-31 ENCOUNTER — Other Ambulatory Visit: Payer: Self-pay | Admitting: Family Medicine

## 2017-10-31 NOTE — Telephone Encounter (Signed)
Patient is overdue for follow-up.  Called patient and left message to return call to schedule appointment.

## 2017-11-01 NOTE — Telephone Encounter (Signed)
Called patient and left message to return call to schedule follow up appointment.

## 2017-11-02 NOTE — Telephone Encounter (Signed)
Called patient and left message to return call

## 2017-11-06 NOTE — Telephone Encounter (Signed)
Unable to reach patient to schedule follow-up appointment.  Please advise refill.

## 2017-11-09 ENCOUNTER — Encounter: Payer: Self-pay | Admitting: Family Medicine

## 2017-11-09 ENCOUNTER — Ambulatory Visit (INDEPENDENT_AMBULATORY_CARE_PROVIDER_SITE_OTHER): Payer: BC Managed Care – PPO | Admitting: Family Medicine

## 2017-11-09 VITALS — BP 140/88 | HR 78 | Temp 98.1°F | Wt 173.0 lb

## 2017-11-09 DIAGNOSIS — J302 Other seasonal allergic rhinitis: Secondary | ICD-10-CM | POA: Diagnosis not present

## 2017-11-09 DIAGNOSIS — S0501XD Injury of conjunctiva and corneal abrasion without foreign body, right eye, subsequent encounter: Secondary | ICD-10-CM

## 2017-11-09 DIAGNOSIS — I10 Essential (primary) hypertension: Secondary | ICD-10-CM | POA: Diagnosis not present

## 2017-11-09 DIAGNOSIS — H1031 Unspecified acute conjunctivitis, right eye: Secondary | ICD-10-CM

## 2017-11-09 MED ORDER — POLYMYXIN B-TRIMETHOPRIM 10000-0.1 UNIT/ML-% OP SOLN: 1.0000 [drp] | OPHTHALMIC | 0 refills | Status: AC

## 2017-11-09 MED ORDER — LISINOPRIL 40 MG PO TABS: 40.0000 mg | ORAL_TABLET | Freq: Every day | ORAL | 3 refills | Status: DC

## 2017-11-09 MED ORDER — LORATADINE 10 MG PO TABS: 10.0000 mg | ORAL_TABLET | Freq: Every day | ORAL | 11 refills | Status: DC

## 2017-11-09 NOTE — Patient Instructions (Signed)
Bacterial Conjunctivitis Bacterial conjunctivitis is an infection of the clear membrane that covers the white part of your eye and the inner surface of your eyelid (conjunctiva). When the blood vessels in your conjunctiva become inflamed, your eye becomes red or pink, and it will probably feel itchy. Bacterial conjunctivitis spreads very easily from person to person (is contagious). It also spreads easily from one eye to the other eye. What are the causes? This condition is caused by several common bacteria. You may get the infection if you come into close contact with another person who is infected. You may also come into contact with items that are contaminated with the bacteria, such as a face towel, contact lens solution, or eye makeup. What increases the risk? This condition is more likely to develop in people who:  Are exposed to other people who have the infection.  Wear contact lenses.  Have a sinus infection.  Have had a recent eye injury or surgery.  Have a weak body defense system (immune system).  Have a medical condition that causes dry eyes.  What are the signs or symptoms? Symptoms of this condition include:  Eye redness.  Tearing or watery eyes.  Itchy eyes.  Burning feeling in your eyes.  Thick, yellowish discharge from an eye. This may turn into a crust on the eyelid overnight and cause your eyelids to stick together.  Swollen eyelids.  Blurred vision.  How is this diagnosed? Your health care provider can diagnose this condition based on your symptoms and medical history. Your health care provider may also take a sample of discharge from your eye to find the cause of your infection. This is rarely done. How is this treated? Treatment for this condition includes:  Antibiotic eye drops or ointment to clear the infection more quickly and prevent the spread of infection to others.  Oral antibiotic medicines to treat infections that do not respond to drops or  ointments, or last longer than 10 days.  Cool, wet cloths (cool compresses) placed on the eyes.  Artificial tears applied 2-6 times a day.  Follow these instructions at home: Medicines  Take or apply your antibiotic medicine as told by your health care provider. Do not stop taking or applying the antibiotic even if you start to feel better.  Take or apply over-the-counter and prescription medicines only as told by your health care provider.  Be very careful to avoid touching the edge of your eyelid with the eye drop bottle or the ointment tube when you apply medicines to the affected eye. This will keep you from spreading the infection to your other eye or to other people. Managing discomfort  Gently wipe away any drainage from your eye with a warm, wet washcloth or a cotton ball.  Apply a cool, clean washcloth to your eye for 10-20 minutes, 3-4 times a day. General instructions  Do not wear contact lenses until the inflammation is gone and your health care provider says it is safe to wear them again. Ask your health care provider how to sterilize or replace your contact lenses before you use them again. Wear glasses until you can resume wearing contacts.  Avoid wearing eye makeup until the inflammation is gone. Throw away any old eye cosmetics that may be contaminated.  Change or wash your pillowcase every day.  Do not share towels or washcloths. This may spread the infection.  Wash your hands often with soap and water. Use paper towels to dry your hands.  Avoid   touching or rubbing your eyes.  Do not drive or use heavy machinery if your vision is blurred. Contact a health care provider if:  You have a fever.  Your symptoms do not get better after 10 days. Get help right away if:  You have a fever and your symptoms suddenly get worse.  You have severe pain when you move your eye.  You have facial pain, redness, or swelling.  You have sudden loss of vision. This  information is not intended to replace advice given to you by your health care provider. Make sure you discuss any questions you have with your health care provider. Document Released: 05/29/2005 Document Revised: 10/07/2015 Document Reviewed: 03/11/2015 Elsevier Interactive Patient Education  2017 [place] DASH stands for "Dietary Approaches to Stop Hypertension." The DASH eating plan is a healthy eating plan that has been shown to reduce high blood pressure (hypertension). It may also reduce your risk for type 2 diabetes, heart disease, and stroke. The DASH eating plan may also help with weight loss. What are tips for following this plan? General guidelines  Avoid eating more than 2,300 mg (milligrams) of salt (sodium) a day. If you have hypertension, you may need to reduce your sodium intake to 1,500 mg a day.  Limit alcohol intake to no more than 1 drink a day for nonpregnant women and 2 drinks a day for men. One drink equals 12 oz of beer, 5 oz of wine, or 1 oz of hard liquor.  Work with your health care provider to maintain a healthy body weight or to lose weight. Ask what an ideal weight is for you.  Get at least 30 minutes of exercise that causes your heart to beat faster (aerobic exercise) most days of the week. Activities may include walking, swimming, or biking.  Work with your health care provider or diet and nutrition specialist (dietitian) to adjust your eating plan to your individual calorie needs. Reading food labels  Check food labels for the amount of sodium per serving. Choose foods with less than 5 percent of the Daily Value of sodium. Generally, foods with less than 300 mg of sodium per serving fit into this eating plan.  To find whole grains, look for the word "whole" as the first word in the ingredient list. Shopping  Buy products labeled as "low-sodium" or "no salt added."  Buy fresh foods. Avoid canned foods and premade or frozen  meals. Cooking  Avoid adding salt when cooking. Use salt-free seasonings or herbs instead of table salt or sea salt. Check with your health care provider or pharmacist before using salt substitutes.  Do not fry foods. Cook foods using healthy methods such as baking, boiling, grilling, and broiling instead.  Cook with heart-healthy oils, such as olive, canola, soybean, or sunflower oil. Meal planning   Eat a balanced diet that includes: ? 5 or more servings of fruits and vegetables each day. At each meal, try to fill half of your plate with fruits and vegetables. ? Up to 6-8 servings of whole grains each day. ? Less than 6 oz of lean meat, poultry, or fish each day. A 3-oz serving of meat is about the same size as a deck of cards. One egg equals 1 oz. ? 2 servings of low-fat dairy each day. ? A serving of nuts, seeds, or beans 5 times each week. ? Heart-healthy fats. Healthy fats called Omega-3 fatty acids are found in foods such as flaxseeds and coldwater  fish, like sardines, salmon, and mackerel.  Limit how much you eat of the following: ? Canned or prepackaged foods. ? Food that is high in trans fat, such as fried foods. ? Food that is high in saturated fat, such as fatty meat. ? Sweets, desserts, sugary drinks, and other foods with added sugar. ? Full-fat dairy products.  Do not salt foods before eating.  Try to eat at least 2 vegetarian meals each week.  Eat more home-cooked food and less restaurant, buffet, and fast food.  When eating at a restaurant, ask that your food be prepared with less salt or no salt, if possible. What foods are recommended? The items listed may not be a complete list. Talk with your dietitian about what dietary choices are best for you. Grains Whole-grain or whole-wheat bread. Whole-grain or whole-wheat pasta. Brown rice. [place]. Bulgur. Whole-grain and low-sodium cereals. Pita bread. Low-fat, low-sodium crackers. Whole-wheat flour  tortillas. Vegetables Fresh or frozen vegetables (raw, steamed, roasted, or grilled). Low-sodium or reduced-sodium tomato and vegetable juice. Low-sodium or reduced-sodium tomato sauce and tomato paste. Low-sodium or reduced-sodium canned vegetables. Fruits All fresh, dried, or frozen fruit. Canned fruit in natural juice (without added sugar). Meat and other protein foods Skinless chicken or [place]. Ground chicken or [place]. Pork with fat trimmed off. Fish and seafood. Egg whites. Dried beans, peas, or lentils. Unsalted nuts, nut butters, and seeds. Unsalted canned beans. Lean cuts of beef with fat trimmed off. Low-sodium, lean deli meat. Dairy Low-fat (1%) or fat-free (skim) milk. Fat-free, low-fat, or reduced-fat cheeses. Nonfat, low-sodium ricotta or cottage cheese. Low-fat or nonfat yogurt. Low-fat, low-sodium cheese. Fats and oils Soft margarine without trans fats. Vegetable oil. Low-fat, reduced-fat, or light mayonnaise and salad dressings (reduced-sodium). Canola, safflower, olive, soybean, and sunflower oils. Avocado. Seasoning and other foods Herbs. Spices. Seasoning mixes without salt. Unsalted popcorn and pretzels. Fat-free sweets. What foods are not recommended? The items listed may not be a complete list. Talk with your dietitian about what dietary choices are best for you. Grains Baked goods made with fat, such as croissants, muffins, or some breads. Dry pasta or rice meal packs. Vegetables Creamed or fried vegetables. Vegetables in a cheese sauce. Regular canned vegetables (not low-sodium or reduced-sodium). Regular canned tomato sauce and paste (not low-sodium or reduced-sodium). Regular tomato and vegetable juice (not low-sodium or reduced-sodium). Angie Fava. Olives. Fruits Canned fruit in a light or heavy syrup. Fried fruit. Fruit in cream or butter sauce. Meat and other protein foods Fatty cuts of meat. Ribs. Fried meat. Berniece Salines. Sausage. Bologna and other processed lunch meats.  Salami. Fatback. Hotdogs. Bratwurst. Salted nuts and seeds. Canned beans with added salt. Canned or smoked fish. Whole eggs or egg yolks. Chicken or [place] with skin. Dairy Whole or 2% milk, cream, and half-and-half. Whole or full-fat cream cheese. Whole-fat or sweetened yogurt. Full-fat cheese. Nondairy creamers. Whipped toppings. Processed cheese and cheese spreads. Fats and oils Butter. Stick margarine. Lard. Shortening. Ghee. Bacon fat. Tropical oils, such as coconut, palm kernel, or palm oil. Seasoning and other foods Salted popcorn and pretzels. Onion salt, garlic salt, seasoned salt, table salt, and sea salt. Worcestershire sauce. Tartar sauce. Barbecue sauce. Teriyaki sauce. Soy sauce, including reduced-sodium. Steak sauce. Canned and packaged gravies. Fish sauce. Oyster sauce. Cocktail sauce. Horseradish that you find on the shelf. Ketchup. Mustard. Meat flavorings and tenderizers. Bouillon cubes. Hot sauce and Tabasco sauce. Premade or packaged marinades. Premade or packaged taco seasonings. Relishes. Regular salad dressings. Where to find more  information:  National Heart, Lung, and Blood Institute: https://[place].com/  American Heart Association: www.heart.org Summary  The DASH eating plan is a healthy eating plan that has been shown to reduce high blood pressure (hypertension). It may also reduce your risk for type 2 diabetes, heart disease, and stroke.  With the DASH eating plan, you should limit salt (sodium) intake to 2,300 mg a day. If you have hypertension, you may need to reduce your sodium intake to 1,500 mg a day.  When on the DASH eating plan, aim to eat more fresh fruits and vegetables, whole grains, lean proteins, low-fat dairy, and heart-healthy fats.  Work with your health care provider or diet and nutrition specialist (dietitian) to adjust your eating plan to your individual calorie needs. This information is not intended to replace advice given to you by your health  care provider. Make sure you discuss any questions you have with your health care provider. Document Released: 05/18/2011 Document Revised: 05/22/2016 Document Reviewed: 05/22/2016 Elsevier Interactive Patient Education  2018 [employer].  Exercising to [employer] Exercising regularly is important. It has many health benefits, such as:  Improving your overall fitness, flexibility, and endurance.  Increasing your bone density.  Helping with weight control.  Decreasing your body fat.  Increasing your muscle strength.  Reducing stress and tension.  Improving your overall health.  In order to become healthy and stay healthy, it is recommended that you do moderate-intensity and vigorous-intensity exercise. You can tell that you are exercising at a moderate intensity if you have a higher heart rate and faster breathing, but you are still able to hold a conversation. You can tell that you are exercising at a vigorous intensity if you are breathing much harder and faster and cannot hold a conversation while exercising. How often should I exercise? Choose an activity that you enjoy and set realistic goals. Your health care provider can help you to make an activity plan that works for you. Exercise regularly as directed by your health care provider. This may include:  Doing resistance training twice each week, such as: ? Push-ups. ? Sit-ups. ? Lifting weights. ? Using resistance bands.  Doing a given intensity of exercise for a given amount of time. Choose from these options: ? 150 minutes of moderate-intensity exercise every week. ? 75 minutes of vigorous-intensity exercise every week. ? A mix of moderate-intensity and vigorous-intensity exercise every week.  Children, pregnant women, people who are out of shape, people who are overweight, and older adults may need to consult a health care provider for individual recommendations. If you have any sort of medical condition, be sure to  consult your health care provider before starting a new exercise program. What are some exercise ideas? Some moderate-intensity exercise ideas include:  Walking at a rate of 1 mile in 15 minutes.  Biking.  Hiking.  Golfing.  Dancing.  Some vigorous-intensity exercise ideas include:  Walking at a rate of at least 4.5 miles per hour.  Jogging or running at a rate of 5 miles per hour.  Biking at a rate of at least 10 miles per hour.  Lap swimming.  Roller-skating or in-line skating.  Cross-country skiing.  Vigorous competitive sports, such as football, basketball, and soccer.  Jumping rope.  Aerobic dancing.  What are some everyday activities that can help me to get exercise?  [place] work, such as: ? Pushing a Conservation officer, nature. ? Raking and bagging leaves.  Washing and waxing your car.  Pushing a stroller.  Shoveling snow.  Gardening.  Washing windows or floors. How can I be more active in my day-to-day activities?  Use the stairs instead of the elevator.  Take a walk during your lunch break.  If you drive, park your car farther away from work or school.  If you take public transportation, get off one stop early and walk the rest of the way.  Make all of your phone calls while standing up and walking around.  Get up, stretch, and walk around every 30 minutes throughout the day. What guidelines should I follow while exercising?  Do not exercise so much that you hurt yourself, feel dizzy, or get very short of breath.  Consult your health care provider before starting a new exercise program.  Wear comfortable clothes and shoes with good support.  Drink plenty of water while you exercise to prevent dehydration or heat stroke. Body water is lost during exercise and must be replaced.  Work out until you breathe faster and your heart beats faster. This information is not intended to replace advice given to you by your health care provider. Make sure you discuss  any questions you have with your health care provider. Document Released: 07/01/2010 Document Revised: 11/04/2015 Document Reviewed: 10/30/2013 Elsevier Interactive Patient Education  [employer].

## 2017-11-09 NOTE — Progress Notes (Signed)
Subjective:    Patient ID: Cindy Zimmerman, female    DOB: 07/21/1965, 52 y.o.   MRN: [id number]  No chief complaint on file.   HPI Patient was seen today for follow-up.  Patient endorses compliance with lisinopril 20 mg daily.  Pt is not checking BP at home.  Pt denies headaches, changes in vision, chest pain, dry cough.  Pt states she is been eating healthy when she can.  Pt is also trying to walk as of last week.  Pt was seen on 06/08/2017 for an abrasion of her cornea.  Ever since then she states she has had redness and irritation in her right eye.  Pt was seen by her optometrist and given eyedrops.  Pt states she was unable to get one eyedrop as it was $600, however she cannot recall the name.  Patient has been taking Zyrtec for allergies.  She is not sure that is helping.  Patient continues to have irritated eyes.  Past Medical History:  Diagnosis Date  . Fibroid   . Hypertension   . Leiomyoma     Allergies  Allergen Reactions  . Fish Allergy   . Strawberry (Diagnostic)   . Tamiflu [Oseltamivir] Hives    ROS General: Denies fever, chills, night sweats, changes in weight, changes in appetite HEENT: Denies headaches, ear pain, changes in vision, rhinorrhea, sore throat  +R eye irritation and drainage CV: Denies CP, palpitations, SOB, orthopnea Pulm: Denies SOB, cough, wheezing GI: Denies abdominal pain, nausea, vomiting, diarrhea, constipation GU: Denies dysuria, hematuria, frequency, vaginal discharge Msk: Denies muscle cramps, joint pains Neuro: Denies weakness, numbness, tingling Skin: Denies rashes, bruising Psych: Denies depression, anxiety, hallucinations    Objective:    Blood pressure (!) 138/100, pulse 78, temperature 98.1 F (36.7 C), temperature source Oral, weight 173 lb (78.5 kg), last menstrual period 07/12/1999, SpO2 98 %.  Repeat 140/88  Gen. Pleasant, well-nourished, in no distress, normal affect   HEENT: [place]/AT, face symmetric, conjunctival injection  of R eye, purulent drainage noted, no scleral icterus, PERRLA, EOMI, nares patent without drainage, pharynx without erythema or exudate. Lungs: no accessory muscle use Cardiovascular: RRR, no peripheral edema Neuro:  A&Ox3, CN II-XII intact, normal gait    Wt Readings from Last 3 Encounters:  11/09/17 173 lb (78.5 kg)  06/08/17 170 lb (77.1 kg)  04/06/17 168 lb 6.4 oz (76.4 kg)    Lab Results  Component Value Date   WBC 4.3 04/06/2017   HGB 12.8 04/06/2017   HCT 40.2 04/06/2017   PLT 195.0 04/06/2017   GLUCOSE 91 04/06/2017   CHOL 195 04/06/2017   TRIG 130.0 04/06/2017   HDL 60.30 04/06/2017   LDLCALC 109 (H) 04/06/2017   ALT 11 04/06/2017   AST 15 04/06/2017   NA 141 04/06/2017   K 3.8 04/06/2017   CL 103 04/06/2017   CREATININE 0.74 04/06/2017   BUN 11 04/06/2017   CO2 32 04/06/2017   TSH 1.13 01/06/2016   HGBA1C 6.2 04/06/2017    Assessment/Plan:  Essential hypertension  -BP initially 138/100, repeat 140/88 -Discussed making lifestyle modifications -Patient to limit sodium intake, increase p.o. intake of water, increase physical activity -We will increase lisinopril to 40 mg daily.  Patient advised to monitor for dry cough. -Patient encouraged to check BP at home and keep a log. -Patient to return to clinic in 1 month, sooner if needed for BP recheck - Plan: lisinopril (PRINIVIL,ZESTRIL) 40 MG tablet  Acute bacterial conjunctivitis of right eye  -  Plan: trimethoprim-polymyxin b (POLYTRIM) ophthalmic solution  Seasonal allergies -Patient to stop Zyrtec as it does not seem to help symptoms -We will start Claritin - Plan: loratadine (CLARITIN) 10 MG tablet  Abrasion of right cornea, subsequent encounter  - Plan: Ambulatory referral to Ophthalmology  Follow-up in 1 month  Grier Mitts, MD

## 2017-11-14 NOTE — Telephone Encounter (Signed)
Pt was seen last wk.  At that time her lisinopril was increased to 40 mg.

## 2017-12-06 ENCOUNTER — Ambulatory Visit: Payer: BC Managed Care – PPO | Admitting: Allergy

## 2017-12-06 ENCOUNTER — Encounter: Payer: Self-pay | Admitting: Allergy

## 2017-12-06 VITALS — BP 136/82 | HR 73 | Temp 97.8°F | Resp 20 | Ht 60.8 in | Wt 173.4 lb

## 2017-12-06 DIAGNOSIS — J309 Allergic rhinitis, unspecified: Secondary | ICD-10-CM

## 2017-12-06 DIAGNOSIS — H1010 Acute atopic conjunctivitis, unspecified eye: Secondary | ICD-10-CM

## 2017-12-06 DIAGNOSIS — Z91018 Allergy to other foods: Secondary | ICD-10-CM

## 2017-12-06 MED ORDER — EPINEPHRINE 0.3 MG/0.3ML IJ SOAJ: 0.3000 mg | Freq: Once | INTRAMUSCULAR | 2 refills | Status: AC

## 2017-12-06 NOTE — Progress Notes (Signed)
New Patient Note  RE: Cindy Zimmerman MRN: [id number] DOB: 1965/12/18 Date of Office Visit: 12/06/2017  Referring provider: Billie Ruddy, MD Primary care provider: Billie Ruddy, MD  Chief Complaint: seasonal allergies   History of present illness: Cindy Zimmerman is a 52 y.o. female presenting today for consultation for seasonal allergies.    She reports watery eyes mostly of her right eye.  She has seen ophthalmologist who diagnosed with blocked tear duct and a bacterial conjunctivitis.  She was prescribed cephalexin course and will be seeing her again in follow-up tomorrow.   Since being on the antibiotic she does feel her right eye is tearing a lot less and is less goopy.   She initially was not sure if her eye symptoms were related to allergy or not.   She has been taking zyrtec and does not feel it has been helping.  She was changed to Claritin by her PCP.  She states she could not tell a difference with Claritin either in her eye symptoms.   She does not endorse significant nasal symptoms or systemic symptoms like sneezing or pruritus.     She states she is allergic to strawberry and fish.  She states she realized she was allergic to fish as every time she ate at a cookout with fish being prepared she would break out in hives.  She also reports she does not eat shellfish due to a fish allergy.  She states she also can't go to restaurants with seafood as she feels she gets symptomatic.   With strawberry she reports she will develop rash around her mouth.  She first noticed reaction to food around her teen years.  She does not have an up-to-date EpiPen.  No history of history of asthma or eczema.   Review of systems: Review of Systems  Constitutional: Negative for chills, fever and malaise/fatigue.  HENT: Negative for congestion, ear discharge, ear pain, nosebleeds, sinus pain and sore throat.   Eyes: Positive for discharge and redness. Negative for blurred vision,  photophobia and pain.  Respiratory: Negative for cough, shortness of breath and wheezing.   Cardiovascular: Negative for chest pain.  Gastrointestinal: Negative for abdominal pain, constipation, diarrhea, heartburn, nausea and vomiting.  Musculoskeletal: Negative for joint pain.  Skin: Negative for itching and rash.  Neurological: Negative for headaches.    All other systems negative unless noted above in HPI  Past medical history: Past Medical History:  Diagnosis Date  . Fibroid   . Hypertension   . Leiomyoma   . Urticaria    fish    Past surgical history: Past Surgical History:  Procedure Laterality Date  . ABDOMINAL HYSTERECTOMY     TAH/LOA  . CHOLECYSTECTOMY    . COLONOSCOPY    . HYSTEROSCOPY  4/00   fibroid hysteroscopic resection  . MYOMECTOMY  7/96   multiple  . ROBOTIC ASSISTED LAPAROSCOPIC VAGINAL HYSTERECTOMY WITH FIBROID REMOVAL      Family history:  Family History  Problem Relation Age of Onset  . Hypertension Mother   . Hypertension Father   . Diabetes Paternal Aunt   . Cancer Paternal Uncle        bone  . Cancer Paternal Grandmother        bone  . Allergic rhinitis Neg Hx   . Asthma Neg Hx   . Eczema Neg Hx   . Urticaria Neg Hx     Social history: She lives in a home with carpeting that  is only 53 years old, electric heating and central cooling in the home.  No pets in the home.  No concern for water damage, mildew or roaches in the home.  She is a Pharmacist, hospital.  She denies a smoking history.  Medication List: Allergies as of 12/06/2017      Reactions   Fish Allergy    Strawberry (diagnostic)    Tamiflu [oseltamivir] Hives      Medication List        Accurate as of 12/06/17  5:01 PM. Always use your most recent med list.          cephALEXin 500 MG capsule Commonly known as:  KEFLEX TAKE ONE CAPSULE BY MOUTH EVERY 6 HOURS FOR 7 DAYS   lisinopril 40 MG tablet Commonly known as:  PRINIVIL,ZESTRIL Take 1 tablet (40 mg total) by mouth  daily.   loratadine 10 MG tablet Commonly known as:  CLARITIN Take 1 tablet (10 mg total) by mouth daily.   MULTIVITAMIN PO Take by mouth. occ       Known medication allergies: Allergies  Allergen Reactions  . Fish Allergy   . Strawberry (Diagnostic)   . Tamiflu [Oseltamivir] Hives     Physical examination: Blood pressure 136/82, pulse 73, temperature 97.8 F (36.6 C), temperature source Oral, resp. rate 20, height 5' 0.8" (1.544 m), weight 173 lb 6.4 oz (78.7 kg), last menstrual period 07/12/1999, SpO2 96 %.  General: Alert, interactive, in no acute distress. HEENT: PERRLA, minimal cold loudly discharge in the medial corner of her right eye,  TMs pearly gray, turbinates minimally edematous without discharge, post-pharynx non erythematous. Neck: Supple without lymphadenopathy. Lungs: Clear to auscultation without wheezing, rhonchi or rales. {no increased work of breathing. CV: Normal S1, S2 without murmurs. Abdomen: Nondistended, nontender. Skin: Warm and dry, without lesions or rashes. Extremities:  No clubbing, cyanosis or edema. Neuro:   Grossly intact.  Diagnositics/Labs: Allergy testing: Environmental allergy skin prick testing is positive to weeds, trees, molds.  Intradermal testing is negative. Select skin prick testing to fish panel, shellfish panel, coconut, banana and strawberry is negative to all except for scallops which is positive  Allergy testing results were read and interpreted by provider, documented by clinical staff.   Assessment and plan:   Allergic rhinoconjunctivitis   - environmental allergy skin testing is positive to weed pollen, tree pollen and molds   - allergen avoidance measures discusses/handouts provided   - trial Allegra 180mg  daily to see if this is more effective antihistamine than Claritin, Zyrtec or Xyzal   - for itchy/watery/eyes would recommend use of allergy eyedrops like Pataday or Pazeo (OTC including Alaway or Zaditor).   However before trying these drops want to ensure your bacterial conjunctivitis has resolved.  May discuss these allergy relief eyedrops with your ophthalmologist as well.      - at this time you are not having significant nasal symptoms but if nasal symptoms (congestion, drainage) occurs use Flonase 2 sprays each nostril daily for 1-2 weeks at a time before stopping.      -She will complete her cephalexin course as directed by her ophthalmologist for bacterial conjunctivitis  Food allergy    - select food allergy skin testing today is positive to scallops.  Remaining shellfish, fish, coconut, strawberry and banana are negative    - continue current food avoidance    - have access to self-injectable epinephrine (Epipen or AuviQ) 0.3mg  at all times    - follow emergency action plan in  case of allergic reaction  Follow- up 6 months or sooner if needed  I appreciate the opportunity to take part in Shamaine's care. Please do not hesitate to contact me with questions.  Sincerely,   Prudy Feeler, MD Allergy/Immunology [facility]

## 2017-12-06 NOTE — Patient Instructions (Addendum)
Allergic rhinoconjunctivitis   - environmental allergy skin testing is positive to weed pollen, tree pollen and molds   - allergen avoidance measures discusses/handouts provided   - trial Allegra 180mg  daily to see if this is more effective antihistamine than Claritin, Zyrtec or Xyzal   - for itchy/watery/eyes would recommend use of allergy eyedrops like Pataday or Pazeo (OTC including Alaway or Zaditor).  However before trying these drops want to ensure your bacterial conjunctivitis has resolved.  May discuss these allergy relief eyedrops with your ophthalmologist as well.      - at this time you are not having significant nasal symptoms but if nasal symptoms (congestion, drainage) occurs use Flonase 2 sprays each nostril daily for 1-2 weeks at a time before stopping.     Food allergy    - select food allergy skin testing today is positive to scallops.  Remaining shellfish, fish, coconut, strawberry and banana are negative    - continue current food avoidance    - have access to self-injectable epinephrine (Epipen or AuviQ) 0.3mg  at all times    - follow emergency action plan in case of allergic reaction  Follow- up 6 months or sooner if needed

## 2018-01-11 ENCOUNTER — Other Ambulatory Visit: Payer: Self-pay

## 2018-01-11 ENCOUNTER — Encounter: Payer: Self-pay | Admitting: Certified Nurse Midwife

## 2018-01-11 ENCOUNTER — Ambulatory Visit: Payer: BC Managed Care – PPO | Admitting: Certified Nurse Midwife

## 2018-01-11 VITALS — BP 115/78 | HR 78 | Resp 14 | Ht 60.0 in | Wt 177.6 lb

## 2018-01-11 DIAGNOSIS — Z01419 Encounter for gynecological examination (general) (routine) without abnormal findings: Secondary | ICD-10-CM

## 2018-01-11 DIAGNOSIS — Z8679 Personal history of other diseases of the circulatory system: Secondary | ICD-10-CM | POA: Diagnosis not present

## 2018-01-11 NOTE — Patient Instructions (Signed)

## 2018-01-11 NOTE — Progress Notes (Signed)
52 y.o. G0P0000 Single  African American Fe here for annual exam. Menopausal no HRT. Denies vaginal dryness. Sees PCP Dr. Volanda Napoleon for hypertension/allergy management,decreased medication for hypertension, working well. Plans to start on weight control to help with reduction of dose and eventually would like to be off if possible.. All labs with PCP were normal per patient Has been a good year. 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   Patient's last menstrual period was 07/12/1999 (exact date).          Sexually active: No.  The current method of family planning is Hysterectomy     Exercising: Yes.    has been walking in the morning  Smoker:  no  Review of Systems  All other systems reviewed and are negative.   Health Maintenance: Pap:  12-04-08 neg History of Abnormal Pap: remote history abnormal  Per patient . MMG:  01-16-17 category b density birads 3:neg f/u 28yr Self Breast exams: yes Colonoscopy:  2018 BMD:   March 2018 TDaP:  2013 Shingles: no Pneumonia: no Hep C and HIV: done yrs ago Labs: PCP   reports that she has never smoked. She has never used smokeless tobacco. She reports that she does not drink alcohol or use drugs.  Past Medical History:  Diagnosis Date  . Fibroid   . Hypertension   . Leiomyoma   . Urticaria    fish    Past Surgical History:  Procedure Laterality Date  . ABDOMINAL HYSTERECTOMY     TAH/LOA  . CHOLECYSTECTOMY    . COLONOSCOPY    . HYSTEROSCOPY  4/00   fibroid hysteroscopic resection  . MYOMECTOMY  7/96   multiple  . ROBOTIC ASSISTED LAPAROSCOPIC VAGINAL HYSTERECTOMY WITH FIBROID REMOVAL      Current Outpatient Medications  Medication Sig Dispense Refill  . cephALEXin (KEFLEX) 500 MG capsule TAKE ONE CAPSULE BY MOUTH EVERY 6 HOURS FOR 7 DAYS  0  . lisinopril (PRINIVIL,ZESTRIL) 40 MG tablet Take 1 tablet (40 mg total) by mouth daily. 30 tablet 3  . loratadine (CLARITIN) 10 MG tablet Take 1 tablet (10 mg total) by mouth daily. 30 tablet  11  . Multiple Vitamins-Minerals (MULTIVITAMIN PO) Take by mouth. occ     No current facility-administered medications for this visit.     Family History  Problem Relation Age of Onset  . Hypertension Mother   . Hypertension Father   . Diabetes Paternal Aunt   . Cancer Paternal Uncle        bone  . Cancer Paternal Grandmother        bone  . Allergic rhinitis Neg Hx   . Asthma Neg Hx   . Eczema Neg Hx   . Urticaria Neg Hx     ROS:  Pertinent items are noted in HPI.  Otherwise, a comprehensive ROS was negative.  Exam:   LMP 07/12/1999 (Exact Date)    Ht Readings from Last 3 Encounters:  12/06/17 5' 0.8" (1.544 m)  04/06/17 5' (1.524 m)  01/09/17 5' 0.5" (1.537 m)    General appearance: alert, cooperative and appears stated age Head: Normocephalic, without obvious abnormality, atraumatic Neck: no adenopathy, supple, symmetrical, trachea midline and thyroid normal to inspection and palpation Lungs: clear to auscultation bilaterally Breasts: normal appearance, no masses or tenderness, No nipple retraction or dimpling, No nipple discharge or bleeding, No axillary or supraclavicular adenopathy Heart: regular rate and rhythm Abdomen: soft, non-tender; no masses,  no organomegaly Extremities: extremities normal, atraumatic, no  cyanosis or edema Skin: Skin color, texture, turgor normal. No rashes or lesions Lymph nodes: Cervical, supraclavicular, and axillary nodes normal. No abnormal inguinal nodes palpated Neurologic: Grossly normal   Pelvic: External genitalia:  no lesions              Urethra:  normal appearing urethra with no masses, tenderness or lesions              Bartholin's and Skene's: normal                 Vagina: normal appearing vagina with normal color and discharge, no lesions              Cervix: absent              Pap taken: No. Bimanual Exam:  Uterus:  uterus absent              Adnexa: normal adnexa and no mass, fullness, tenderness                Rectovaginal: Confirms               Anus:  normal sphincter tone, no lesions  Chaperone present: yes  A:  Well Woman with normal exam  Menopausal s/p TVH for fibroid, ovaries retained  Hypertension, allergy management with PCP  Overweight ,but plans to work on reduction  Mammogram due this month, patient will schedule  P:   Reviewed health and wellness pertinent to exam  Continue follow up with PCP as indicated  Work on weight to help with hypertension medication, continue follow up with MD as indicated  Pap smear: no   counseled on breast self exam, mammography screening, feminine hygiene, adequate intake of calcium and vitamin D, diet and exercise  return annually or prn  An After Visit Summary was printed and given to the patient.

## 2018-01-23 ENCOUNTER — Other Ambulatory Visit (HOSPITAL_COMMUNITY): Payer: Self-pay | Admitting: Oculoplastics Ophthalmology

## 2018-01-23 DIAGNOSIS — G44001 Cluster headache syndrome, unspecified, intractable: Secondary | ICD-10-CM

## 2018-01-23 DIAGNOSIS — H04301 Unspecified dacryocystitis of right lacrimal passage: Secondary | ICD-10-CM

## 2018-01-24 NOTE — H&P (Signed)
Subjective:    Cindy Zimmerman is a 52 y.o. female who presents for evaluation of dacryocystitis right side. The pain is described as minimal. Onset was several weeks ago. Symptoms have been unchanged since.   Review of Systems Pertinent items are noted in HPI.    Objective:   LMP 07/12/1999 (Exact Date)   General:  alert, cooperative and appears stated age Skin:  normal Eyes: positive findings: eyelids/periorbital: dacryocystitis on the right and purulent drainage expressable with direct pressure Mouth: MMM no lesions Lymph Nodes:  Cervical, supraclavicular, and axillary nodes normal. Lungs:  clear to auscultation bilaterally Heart:  regular rate and rhythm, S1, S2 normal, no murmur, click, rub or gallop Abdomen: soft, non-tender; bowel sounds normal; no masses,  no organomegaly CVA:  absent Genitourinary: defer exam Extremities:  extremities normal, atraumatic, no cyanosis or edema Neurologic:  negative Psychiatric:  normal mood, behavior, speech, dress, and thought processes    Assessment: Dacryocystitis Right Side    Plan: Dacryocystorhinostomy with Anterior Ethmoidectomy, Punctoplasty Probing Dilation with Stent Placement, Possible Lacrimal Sac Biopsy RIGHT SIDE   1. Discussed the risk of surgery,  and the risks of general anesthetic including MI, CVA, sudden death or even reaction to anesthetic medications. The patient understands the risks, any and all questions were answered to the patient's satisfaction. 2. Follow up: 10 days.  Date of Surgery Update (To be completed by Attending Surgeon day of surgery.)

## 2018-01-25 ENCOUNTER — Ambulatory Visit (HOSPITAL_COMMUNITY)
Admission: RE | Admit: 2018-01-25 | Discharge: 2018-01-25 | Disposition: A | Discharge status: Home / Self Care | Payer: BC Managed Care – PPO | Source: Ambulatory Visit | Attending: Oculoplastics Ophthalmology | Admitting: Oculoplastics Ophthalmology

## 2018-01-25 DIAGNOSIS — H04301 Unspecified dacryocystitis of right lacrimal passage: Secondary | ICD-10-CM | POA: Diagnosis present

## 2018-01-25 DIAGNOSIS — J329 Chronic sinusitis, unspecified: Secondary | ICD-10-CM | POA: Insufficient documentation

## 2018-01-28 ENCOUNTER — Encounter (HOSPITAL_COMMUNITY): Payer: Self-pay | Admitting: *Deleted

## 2018-01-28 ENCOUNTER — Other Ambulatory Visit: Payer: Self-pay

## 2018-01-28 NOTE — Progress Notes (Signed)
Spoke with pt for pre-op call. Pt denies cardiac history or diabetes.  

## 2018-02-10 ENCOUNTER — Encounter (HOSPITAL_COMMUNITY): Admission: RE | Payer: Self-pay | Source: Ambulatory Visit

## 2018-02-10 ENCOUNTER — Ambulatory Visit (HOSPITAL_COMMUNITY)
Admission: RE | Admit: 2018-02-10 | Payer: BC Managed Care – PPO | Source: Ambulatory Visit | Admitting: Oculoplastics Ophthalmology

## 2018-02-10 HISTORY — DX: Anemia, unspecified: D64.9

## 2018-02-10 SURGERY — DACRYOCYSTORHINOSTOMY
Anesthesia: General | Laterality: Right

## 2018-03-01 ENCOUNTER — Other Ambulatory Visit: Payer: Self-pay | Admitting: Family Medicine

## 2018-03-01 DIAGNOSIS — I10 Essential (primary) hypertension: Secondary | ICD-10-CM

## 2018-03-20 ENCOUNTER — Other Ambulatory Visit: Payer: Self-pay | Admitting: Certified Nurse Midwife

## 2018-03-20 DIAGNOSIS — R921 Mammographic calcification found on diagnostic imaging of breast: Secondary | ICD-10-CM

## 2018-03-28 ENCOUNTER — Other Ambulatory Visit: Payer: Self-pay | Admitting: Certified Nurse Midwife

## 2018-03-28 ENCOUNTER — Other Ambulatory Visit: Payer: Self-pay

## 2018-03-28 DIAGNOSIS — R921 Mammographic calcification found on diagnostic imaging of breast: Secondary | ICD-10-CM

## 2018-04-01 ENCOUNTER — Ambulatory Visit
Admission: RE | Admit: 2018-04-01 | Discharge: 2018-04-01 | Disposition: A | Discharge status: Home / Self Care | Payer: BC Managed Care – PPO | Source: Ambulatory Visit | Attending: Certified Nurse Midwife | Admitting: Certified Nurse Midwife

## 2018-04-01 DIAGNOSIS — R921 Mammographic calcification found on diagnostic imaging of breast: Secondary | ICD-10-CM

## 2018-04-04 ENCOUNTER — Ambulatory Visit: Admit: 2018-04-04 | Payer: BC Managed Care – PPO | Admitting: Oculoplastics Ophthalmology

## 2018-04-04 SURGERY — DACRYOCYSTORHINOSTOMY
Anesthesia: General | Laterality: Right

## 2018-06-07 ENCOUNTER — Ambulatory Visit: Payer: Self-pay

## 2018-06-07 NOTE — Telephone Encounter (Signed)
Pt. Reports she started coughing "about a month ago." States it was only at night, "but now it is day and night." Has "post nasal drip this week". No other symptoms.Is taking Claritin. Pt. Is on Lisinopril. Appointment made with Dr. Volanda Napoleon. Next available - [id number]. Please forward to provider.  Reason for Disposition . Taking an ACE Inhibitor medication  (e.g., benazepril/LOTENSIN, captopril/CAPOTEN, enalapril/VASOTEC, lisinopril/ZESTRIL)  Answer Assessment - Initial Assessment Questions 1. ONSET: "When did the cough begin?"      Started coughing just at night, but now day and night. 1 month ago 2. SEVERITY: "How bad is the cough today?"      Moderate 3. RESPIRATORY DISTRESS: "Describe your breathing."      No distress 4. FEVER: "Do you have a fever?" If so, ask: "What is your temperature, how was it measured, and when did it start?"     No 5. HEMOPTYSIS: "Are you coughing up any blood?" If so ask: "How much?" (flecks, streaks, tablespoons, etc.)     No 6. TREATMENT: "What have you done so far to treat the cough?" (e.g., meds, fluids, humidifier)     Claritin 7. CARDIAC HISTORY: "Do you have any history of heart disease?" (e.g., heart attack, congestive heart failure)      HTN 8. LUNG HISTORY: "Do you have any history of lung disease?"  (e.g., pulmonary embolus, asthma, emphysema)     No 9. PE RISK FACTORS: "Do you have a history of blood clots?" (or: recent major surgery, recent prolonged travel, bedridden)     No 10. OTHER SYMPTOMS: "Do you have any other symptoms? (e.g., runny nose, wheezing, chest pain)       Post-nasal drip 11. PREGNANCY: "Is there any chance you are pregnant?" "When was your last menstrual period?"       No 12. TRAVEL: "Have you traveled out of the country in the last month?" (e.g., travel history, exposures)       No  Protocols used: COUGH - ACUTE NON-PRODUCTIVE-A-AH

## 2018-06-20 ENCOUNTER — Ambulatory Visit: Payer: BC Managed Care – PPO | Admitting: Family Medicine

## 2018-06-20 ENCOUNTER — Encounter: Payer: Self-pay | Admitting: Family Medicine

## 2018-06-20 ENCOUNTER — Encounter

## 2018-06-20 VITALS — BP 130/94 | HR 72 | Temp 97.0°F

## 2018-06-20 DIAGNOSIS — R0982 Postnasal drip: Secondary | ICD-10-CM

## 2018-06-20 DIAGNOSIS — H04551 Acquired stenosis of right nasolacrimal duct: Secondary | ICD-10-CM | POA: Diagnosis not present

## 2018-06-20 DIAGNOSIS — I10 Essential (primary) hypertension: Secondary | ICD-10-CM | POA: Diagnosis not present

## 2018-06-20 DIAGNOSIS — R05 Cough: Secondary | ICD-10-CM

## 2018-06-20 DIAGNOSIS — R059 Cough, unspecified: Secondary | ICD-10-CM

## 2018-06-20 MED ORDER — FLUTICASONE PROPIONATE 50 MCG/ACT NA SUSP
1.0000 | Freq: Every day | NASAL | 1 refills | Status: DC
Start: 2018-06-20 — End: 2018-11-05

## 2018-06-20 NOTE — Progress Notes (Signed)
Subjective:    Patient ID: Cindy Zimmerman, female    DOB: 13-Nov-1965, 53 y.o.   MRN: [id number]  No chief complaint on file.   HPI Patient was seen today for acute concern.  Pt endorses cough x2 weeks.  Symptoms initially started as productive cough with cold symptoms, but now dry cough.  Pt endorses increased sputum production at night.  Cough is waking pt up at night.  Pt denies fever, chills, nausea, vomiting.  Has tried OTC cough med.  Pt expresses frustration with several of her specialist.  Pt with h/o blocked R tear duct.  Pt was seeing an ophthalmologist who advised pt she needed surgery.  The surgery was scheduled, then cancelled as the provider went out of the country.  Pt was then seen by an new provider at a different office, but no plans have been made in regards to treating the issue.  Pt was seen by Dr. Kristeen Miss? and Dr. Donney Rankins?    Past Medical History:  Diagnosis Date  . Anemia    due to fibroid tumors  . Fibroid   . Hypertension   . Leiomyoma   . Urticaria    fish    Allergies  Allergen Reactions  . Fish Allergy Anaphylaxis and Hives  . Strawberry (Diagnostic) Anaphylaxis, Swelling and Other (See Comments)    Eyes swell  . Tamiflu [Oseltamivir] Hives    ROS General: Denies fever, chills, night sweats, changes in weight, changes in appetite HEENT: Denies headaches, ear pain, changes in vision, rhinorrhea, sore throat  +blocked tear duct CV: Denies CP, palpitations, SOB, orthopnea Pulm: Denies SOB, wheezing  +cough GI: Denies abdominal pain, nausea, vomiting, diarrhea, constipation GU: Denies dysuria, hematuria, frequency, vaginal discharge Msk: Denies muscle cramps, joint pains Neuro: Denies weakness, numbness, tingling Skin: Denies rashes, bruising Psych: Denies depression, anxiety, hallucinations    Objective:    Blood pressure (!) 130/94, pulse 72, temperature (!) 97 F (36.1 C), temperature source Oral, last menstrual period 07/12/1999, SpO2 98  %.  Gen. Pleasant, well-nourished, in no distress, normal affect   HEENT: [place]/AT, face symmetric, conjunctiva clear, no scleral icterus, PERRLA. Pt presses on corner of R eye and copious amounts of clear fluid comes from her eye.  Nares patent without drainage, pharynx with postnasal drainage, no erythema or exudate Lungs: no accessory muscle use, CTAB, no wheezes or rales Cardiovascular: RRR, no m/r/g, no peripheral edema Neuro:  A&Ox3, CN II-XII intact, normal gait  Wt Readings from Last 3 Encounters:  01/11/18 177 lb 9.6 oz (80.6 kg)  12/06/17 173 lb 6.4 oz (78.7 kg)  11/09/17 173 lb (78.5 kg)    Lab Results  Component Value Date   WBC 4.3 04/06/2017   HGB 12.8 04/06/2017   HCT 40.2 04/06/2017   PLT 195.0 04/06/2017   GLUCOSE 91 04/06/2017   CHOL 195 04/06/2017   TRIG 130.0 04/06/2017   HDL 60.30 04/06/2017   LDLCALC 109 (H) 04/06/2017   ALT 11 04/06/2017   AST 15 04/06/2017   NA 141 04/06/2017   K 3.8 04/06/2017   CL 103 04/06/2017   CREATININE 0.74 04/06/2017   BUN 11 04/06/2017   CO2 32 04/06/2017   TSH 1.13 01/06/2016   HGBA1C 6.2 04/06/2017    Assessment/Plan:  Postnasal drip  -given handout - Plan: fluticasone (FLONASE) 50 MCG/ACT nasal spray  Cough -discussed possible causes including post viral cough, post nasal drainage/allergies, medications -discussed using flonase/ OTC allergy medication to see if cough improves -May need to  change blood pressure medication from lisinopril as it can cause cough -given handout  Obstruction of right tear duct -Referral to ophthalmology placed  Essential hypertension -bp elevated -continue lisinopril 40 mg -pt advised may need to switch bp medication if cough continues.  Follow-up PRN  Grier Mitts, MD

## 2018-06-20 NOTE — Patient Instructions (Signed)
Postnasal Drip  Postnasal drip is the feeling of mucus going down the back of your throat. Mucus is a slimy substance that moistens and cleans your nose and throat, as well as the air pockets in face bones near your forehead and cheeks (sinuses). Small amounts of mucus pass from your nose and sinuses down the back of your throat all the time. This is normal. When you produce too much mucus or the mucus gets too thick, you can feel it.  Some common causes of postnasal drip include:   Having more mucus because of:  ? A cold or the flu.  ? Allergies.  ? Cold air.  ? Certain medicines.   Having more mucus that is thicker because of:  ? A sinus or nasal infection.  ? Dry air.  ? A food allergy.  Follow these instructions at home:  Relieving discomfort     Gargle with a salt-water mixture 3-4 times a day or as needed. To make a salt-water mixture, completely dissolve -1 tsp of salt in 1 cup of warm water.   If the air in your home is dry, use a humidifier to add moisture to the air.   Use a saline spray or container (neti pot) to flush out the nose (nasal irrigation). These methods can help clear away mucus and keep the nasal passages moist.  General instructions   Take over-the-counter and prescription medicines only as told by your health care provider.   Follow instructions from your health care provider about eating or drinking restrictions. You may need to avoid caffeine.   Avoid things that you know you are allergic to (allergens), like dust, mold, pollen, pets, or certain foods.   Drink enough fluid to keep your urine pale yellow.   Keep all follow-up visits as told by your health care provider. This is important.  Contact a health care provider if:   You have a fever.   You have a sore throat.   You have difficulty swallowing.   You have headache.   You have sinus pain.   You have a cough that does not go away.   The mucus from your nose becomes thick and is green or yellow in color.   You have  cold or flu symptoms that last more than 10 days.  Summary   Postnasal drip is the feeling of mucus going down the back of your throat.   If your health care provider approves, use nasal irrigation or a nasal spray 2?4 times a day.   Avoid things that you know you are allergic to (allergens), like dust, mold, pollen, pets, or certain foods.  This information is not intended to replace advice given to you by your health care provider. Make sure you discuss any questions you have with your health care provider.  Document Released: 09/11/2016 Document Revised: 09/11/2016 Document Reviewed: 09/11/2016  [employer]vier Interactive Patient Education  2019 [employer]

## 2018-06-24 ENCOUNTER — Encounter: Payer: Self-pay | Admitting: Family Medicine

## 2018-07-16 ENCOUNTER — Other Ambulatory Visit: Payer: Self-pay | Admitting: Family Medicine

## 2018-07-16 DIAGNOSIS — I10 Essential (primary) hypertension: Secondary | ICD-10-CM

## 2018-11-03 ENCOUNTER — Other Ambulatory Visit: Payer: Self-pay | Admitting: Family Medicine

## 2018-11-03 DIAGNOSIS — I10 Essential (primary) hypertension: Secondary | ICD-10-CM

## 2018-11-05 ENCOUNTER — Other Ambulatory Visit: Payer: Self-pay

## 2018-11-05 DIAGNOSIS — R0982 Postnasal drip: Secondary | ICD-10-CM

## 2018-11-05 MED ORDER — FLUTICASONE PROPIONATE 50 MCG/ACT NA SUSP: 1.0000 | Freq: Every day | NASAL | 1 refills | Status: DC

## 2019-01-14 ENCOUNTER — Other Ambulatory Visit: Payer: Self-pay

## 2019-01-14 ENCOUNTER — Encounter: Payer: Self-pay | Admitting: Certified Nurse Midwife

## 2019-01-14 ENCOUNTER — Ambulatory Visit: Payer: BC Managed Care – PPO | Admitting: Certified Nurse Midwife

## 2019-01-14 ENCOUNTER — Other Ambulatory Visit: Payer: Self-pay | Admitting: Certified Nurse Midwife

## 2019-01-14 VITALS — BP 120/72 | HR 68 | Temp 97.1°F | Ht 60.25 in | Wt 179.0 lb

## 2019-01-14 DIAGNOSIS — Z1231 Encounter for screening mammogram for malignant neoplasm of breast: Secondary | ICD-10-CM

## 2019-01-14 DIAGNOSIS — Z01419 Encounter for gynecological examination (general) (routine) without abnormal findings: Secondary | ICD-10-CM

## 2019-01-14 NOTE — Progress Notes (Signed)
53 y.o. G0P0000 Single  African American Fe here for annual exam. Menopausal no HRT. Denies vaginal dryness.  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 Excited about the change. Worked on weight loss last year and lost 11 pounds. Now has gained this back,and plans to restart. Sees PCP for aex, labs, hypertension management. Plans to start back to walking for exercise.  Patient's last menstrual period was 07/12/1999 (exact date).          Sexually active: No.  The current method of family planning is status post hysterectomy.    Exercising: No.  exercise Smoker:  no  Review of Systems  Constitutional: Negative.   HENT: Negative.   Eyes: Negative.   Respiratory: Negative.   Cardiovascular: Negative.   Gastrointestinal: Negative.   Genitourinary: Negative.   Musculoskeletal: Negative.   Skin: Negative.   Neurological: Negative.   Endo/Heme/Allergies: Negative.   Psychiatric/Behavioral: Negative.     Health Maintenance: Pap:  12-04-08 neg History of Abnormal Pap: yes MMG:  04-01-18 category b density birads 2:neg Self Breast exams: yes Colonoscopy:  2018 BMD:   March 2018 TDaP:  2013 Shingles: no Pneumonia: no Hep C and HIV: not done Labs: no   reports that she has never smoked. She has never used smokeless tobacco. She reports that she does not drink alcohol or use drugs.  Past Medical History:  Diagnosis Date  . Abnormal Pap smear of cervix   . Anemia    due to fibroid tumors  . Fibroid   . Hypertension   . Leiomyoma   . Urticaria    fish    Past Surgical History:  Procedure Laterality Date  . ABDOMINAL HYSTERECTOMY     TAH/LOA  . CHOLECYSTECTOMY    . COLONOSCOPY    . HYSTEROSCOPY  4/00   fibroid hysteroscopic resection  . MYOMECTOMY  7/96   multiple  . ROBOTIC ASSISTED LAPAROSCOPIC VAGINAL HYSTERECTOMY WITH FIBROID REMOVAL      Current Outpatient Medications  Medication Sig Dispense Refill  . fluticasone (FLONASE) 50 MCG/ACT nasal spray Place 1 spray  into both nostrils daily. 16 g 1  . lisinopril (ZESTRIL) 40 MG tablet TAKE 1 TABLET BY MOUTH EVERY DAY 30 tablet 3  . Multiple Vitamins-Minerals (MULTIVITAMIN PO) Take 1 tablet by mouth.      No current facility-administered medications for this visit.     Family History  Problem Relation Age of Onset  . Hypertension Mother   . Thyroid disease Mother   . Hypertension Father   . Diabetes Father   . Diabetes Paternal Aunt   . Cancer Paternal Uncle        bone  . Cancer Paternal Grandmother        bone  . Allergic rhinitis Neg Hx   . Asthma Neg Hx   . Eczema Neg Hx   . Urticaria Neg Hx     ROS:  Pertinent items are noted in HPI.  Otherwise, a comprehensive ROS was negative.  Exam:   BP 120/72   Pulse 68   Temp (!) 97.1 F (36.2 C) (Skin)   Ht 5' 0.25" (1.53 m)   LMP 07/12/1999 (Exact Date)   BMI 34.40 kg/m  Height: 5' 0.25" (153 cm) Ht Readings from Last 3 Encounters:  01/14/19 5' 0.25" (1.53 m)  01/11/18 5' (1.524 m)  12/06/17 5' 0.8" (1.544 m)    General appearance: alert, cooperative and appears stated age Head: Normocephalic, without obvious abnormality, atraumatic Neck: no adenopathy, supple, symmetrical,  trachea midline and thyroid normal to inspection and palpation Lungs: clear to auscultation bilaterally Breasts: normal appearance, no masses or tenderness, No nipple retraction or dimpling, No nipple discharge or bleeding, No axillary or supraclavicular adenopathy, large pendulous Heart: regular rate and rhythm Abdomen: soft, non-tender; no masses,  no organomegaly Extremities: extremities normal, atraumatic, no cyanosis or edema Skin: Skin color, texture, turgor normal. No rashes or lesions Lymph nodes: Cervical, supraclavicular, and axillary nodes normal. No abnormal inguinal nodes palpated Neurologic: Grossly normal   Pelvic: External genitalia:  no lesions              Urethra:  normal appearing urethra with no masses, tenderness or lesions               Bartholin's and Skene's: normal                 Vagina: normal appearing vagina with normal color and discharge, no lesions              Cervix: absent              Pap taken: No. Bimanual Exam:  Uterus:  uterus absent              Adnexa: no mass, fullness, tenderness and left adnexal absent               Rectovaginal: Confirms               Anus:  normal sphincter tone, no lesions  Chaperone present: yes  A:  Well Woman with normal exam  Menopausal no HRT S/P TAH with LBSO  Hypertension with PCP management  Weight gain  P:   Reviewed health and wellness pertinent to exam  Aware of need to advise if vaginal dryness  Continue follow up with PCP as indicated.  Stressed she has worked on before with good success, encouraged to restart!  Pap smear: no   counseled on breast self exam, mammography screening, feminine hygiene, adequate intake of calcium and vitamin D, diet and exercise  return annually or prn  An After Visit Summary was printed and given to the patient.

## 2019-02-18 ENCOUNTER — Other Ambulatory Visit: Payer: Self-pay | Admitting: Family Medicine

## 2019-02-18 DIAGNOSIS — R0982 Postnasal drip: Secondary | ICD-10-CM

## 2019-03-10 ENCOUNTER — Other Ambulatory Visit: Payer: Self-pay | Admitting: Family Medicine

## 2019-03-10 DIAGNOSIS — I10 Essential (primary) hypertension: Secondary | ICD-10-CM

## 2019-03-19 ENCOUNTER — Telehealth: Payer: Self-pay

## 2019-03-19 NOTE — Telephone Encounter (Signed)
Call to patient to notify of denial of Auvi Q epipen.  Also, informed patient via voice mail that she needs to be seen per Dr Nelva Bush.  Last visit 12/06/17, RTC in 6 months. No appointment in the system.

## 2019-04-03 ENCOUNTER — Ambulatory Visit
Admission: RE | Admit: 2019-04-03 | Discharge: 2019-04-03 | Disposition: A | Discharge status: Home / Self Care | Payer: BC Managed Care – PPO | Source: Ambulatory Visit | Attending: Certified Nurse Midwife | Admitting: Certified Nurse Midwife

## 2019-04-03 ENCOUNTER — Other Ambulatory Visit: Payer: Self-pay

## 2019-04-03 DIAGNOSIS — Z1231 Encounter for screening mammogram for malignant neoplasm of breast: Secondary | ICD-10-CM

## 2019-04-05 LAB — HM MAMMOGRAPHY

## 2019-04-07 MED ORDER — EPINEPHRINE 0.3 MG/0.3ML IJ SOAJ: 0.3000 mg | INTRAMUSCULAR | 0 refills | Status: DC | PRN

## 2019-04-07 NOTE — Telephone Encounter (Signed)
Patient called and scheduled appointment for 05/15/2019. 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   Patient stated she does not have an Epi Pen.   Please advise.

## 2019-04-07 NOTE — Telephone Encounter (Signed)
Called and spoke with patient, informed her that usually we don't send refills in if a patient hasn't been seen in over a year however I would send in her Auvi-Q due to her food allergies. I informed her that we certainly don't want her to go with it in case she comes in contact with any of her food allergies. I also informed patient to keep the appt she made on 05/15/2019 so that we can send future refills. Patient verbalized understanding and also informed me that she asked the front staff to put her on a list in case any openings become available before her appt date. She also stated that it would need to be later in the evening and Thursdays would work better for her than Tuesdays.

## 2019-04-07 NOTE — Addendum Note (Signed)
Addended by: Herbie Drape on: 04/07/2019 05:21 PM   Modules accepted: Orders

## 2019-04-14 ENCOUNTER — Encounter: Payer: Self-pay | Admitting: Family Medicine

## 2019-04-14 ENCOUNTER — Other Ambulatory Visit: Payer: Self-pay

## 2019-04-15 ENCOUNTER — Encounter: Payer: Self-pay | Admitting: Family Medicine

## 2019-05-05 ENCOUNTER — Other Ambulatory Visit: Payer: Self-pay | Admitting: Ophthalmology

## 2019-05-15 ENCOUNTER — Encounter: Payer: Self-pay | Admitting: Allergy

## 2019-05-15 ENCOUNTER — Ambulatory Visit: Payer: BC Managed Care – PPO | Admitting: Allergy

## 2019-05-15 ENCOUNTER — Other Ambulatory Visit: Payer: Self-pay

## 2019-05-15 VITALS — BP 140/90 | HR 95 | Temp 97.6°F | Resp 16 | Ht 60.0 in | Wt 176.4 lb

## 2019-05-15 DIAGNOSIS — T7800XA Anaphylactic reaction due to unspecified food, initial encounter: Secondary | ICD-10-CM | POA: Diagnosis not present

## 2019-05-15 DIAGNOSIS — J3089 Other allergic rhinitis: Secondary | ICD-10-CM | POA: Diagnosis not present

## 2019-05-15 DIAGNOSIS — H1013 Acute atopic conjunctivitis, bilateral: Secondary | ICD-10-CM

## 2019-05-15 NOTE — Patient Instructions (Addendum)
Allergic rhinitis with conjunctivitis   - continue avoidance measures for weed pollen, tree pollen and molds   - if needed continue use of Allegra 180mg , Zyrtec 10mg  or Xyzal 5mg  daily    - for itchy/watery/eyes can use of allergy eyedrops like Pataday which is over-the-counter.  May discuss these allergy relief eyedrops with your ophthalmologist as well.      - if needed for congestion/drainage use Flonase 2 sprays each nostril daily for 1-2 weeks at a time before stopping.   Only start use once cleared to use from your ophthalmologist.  Food allergy    - continue avoidance of all fish/shellfish and strawberry    - have access to self-injectable epinephrine  AuviQ 0.3mg  at all times    - follow emergency action plan in case of allergic reaction  Follow- up 6-12 months or sooner if needed

## 2019-05-15 NOTE — Progress Notes (Signed)
Follow-up Note  RE: Cindy Zimmerman MRN: [id number] DOB: 1965/11/20 Date of Office Visit: 05/15/2019   History of present illness: Cindy Zimmerman is a 53 y.o. female presenting today for follow-up of allergic rhinitis with conjunctivitis and food allergy.  She was last seen in the office on December 06, 2017 by myself.  She states she has had a good year without any major health changes or hospitalizations.  She did have tear duct surgery on November 23 that went well.  She states she has been advised by her ophthalmologist that she should not blow her nose until she returns for follow-up next week.  She was also advised not to use any no sprays in her nose until her follow-up.  She does have Flonase at home for nasal congestion but due to the surgery she has not used it.  She also states that she is not on any antihistamines or use any eyedrops in the past month or 2.  She only endorses a mild scratchy throat on occasion. She states she continues to avoid all fish and shellfish and strawberries in her diet.  She has not had any accidental ingestions or need to use her epinephrine device.  Review of systems: Review of Systems  Constitutional: Negative for chills, fever and malaise/fatigue.  HENT: Negative.   Eyes: Negative.   Respiratory: Negative.   Cardiovascular: Negative.   Gastrointestinal: Negative.   Musculoskeletal: Negative.   Skin: Negative.   Neurological: Negative.     All other systems negative unless noted above in HPI  Past medical/social/surgical/family history have been reviewed and are unchanged unless specifically indicated below.  No changes  Medication List: Current Outpatient Medications  Medication Sig Dispense Refill  . EPINEPHrine (AUVI-Q) 0.3 mg/0.3 mL IJ SOAJ injection Inject 0.3 mLs (0.3 mg total) into the muscle as needed for anaphylaxis. 1 each 0  . fluticasone (FLONASE) 50 MCG/ACT nasal spray PLACE 1 SPRAY INTO BOTH NOSTRILS DAILY. 16 mL 1  .  lisinopril (ZESTRIL) 40 MG tablet TAKE 1 TABLET BY MOUTH EVERY DAY 30 tablet 3  . Multiple Vitamins-Minerals (MULTIVITAMIN PO) Take 1 tablet by mouth.      No current facility-administered medications for this visit.      Known medication allergies: Allergies  Allergen Reactions  . Fish Allergy Anaphylaxis and Hives  . Strawberry (Diagnostic) Anaphylaxis, Swelling and Other (See Comments)    Eyes swell  . Tamiflu [Oseltamivir] Hives    Physical examination: Blood pressure 140/90, pulse 95, temperature 97.6 F (36.4 C), temperature source Temporal, resp. rate 16, height 5' (1.524 m), weight 176 lb 6.4 oz (80 kg), last menstrual period 07/12/1999, SpO2 99 %.  General: Alert, interactive, in no acute distress. HEENT: PERRLA, TMs pearly gray, turbinates minimally edematous without discharge, post-pharynx non erythematous. Neck: Supple without lymphadenopathy. Lungs: Clear to auscultation without wheezing, rhonchi or rales. {no increased work of breathing. CV: Normal S1, S2 without murmurs. Abdomen: Nondistended, nontender. Skin: Warm and dry, without lesions or rashes. Extremities:  No clubbing, cyanosis or edema. Neuro:   Grossly intact.  Diagnositics/Labs: None today  Assessment and plan:   Allergic rhinitis with conjunctivitis   - continue avoidance measures for weed pollen, tree pollen and molds   - if needed continue use of Allegra 180mg , Zyrtec 10mg  or Xyzal 5mg  daily    - for itchy/watery/eyes can use of allergy eyedrops like Pataday which is over-the-counter.  May discuss these allergy relief eyedrops with your ophthalmologist as well.     -  if needed for congestion/drainage use Flonase 2 sprays each nostril daily for 1-2 weeks at a time before stopping.   Only start use once cleared to use from your ophthalmologist.  Anaphylaxis due to food    - continue avoidance of all fish/shellfish and strawberry    - have access to self-injectable epinephrine  AuviQ 0.3mg  at all  times    - follow emergency action plan in case of allergic reaction  Follow- up 6-12 months or sooner if needed  I appreciate the opportunity to take part in Kisha's care. Please do not hesitate to contact me with questions.  Sincerely,   Prudy Feeler, MD Allergy/Immunology [facility]

## 2019-05-22 ENCOUNTER — Other Ambulatory Visit: Payer: Self-pay

## 2019-05-22 ENCOUNTER — Telehealth: Payer: Self-pay

## 2019-05-22 ENCOUNTER — Ambulatory Visit: Payer: BC Managed Care – PPO | Admitting: Family Medicine

## 2019-05-22 ENCOUNTER — Encounter: Payer: Self-pay | Admitting: Family Medicine

## 2019-05-22 VITALS — BP 130/82 | HR 61 | Temp 97.8°F | Wt 178.8 lb

## 2019-05-22 DIAGNOSIS — I10 Essential (primary) hypertension: Secondary | ICD-10-CM

## 2019-05-22 MED ORDER — LISINOPRIL 40 MG PO TABS: 40.0000 mg | ORAL_TABLET | Freq: Every day | ORAL | 3 refills | Status: DC

## 2019-05-22 NOTE — Progress Notes (Signed)
Subjective:    Patient ID: Cindy Zimmerman, female    DOB: 1966/05/29, 53 y.o.   MRN: [id number]  Chief Complaint  Patient presents with  . Hypertension    Pt is here to follow up on her blood pressure she states that she has not been having any symptoms     HPI Patient was seen today for f/u on HTN.  Pt taking lisinopril 40 mg daily, but hopes to get off medication.  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  Pt trying to eat better and walk for exercise.  Denies headaches, changes in vision, chest pain.  Pt considering fasting to help lose weight.  Since last OFV pt had procedure for blocked tear duct.  Notes eye is no longer always watery.  Past Medical History:  Diagnosis Date  . Abnormal Pap smear of cervix   . Anemia    due to fibroid tumors  . Fibroid   . Hypertension   . Leiomyoma   . Urticaria    fish    Allergies  Allergen Reactions  . Fish Allergy Anaphylaxis and Hives  . Strawberry (Diagnostic) Anaphylaxis, Swelling and Other (See Comments)    Eyes swell  . Tamiflu [Oseltamivir] Hives    ROS General: Denies fever, chills, night sweats, changes in weight, changes in appetite HEENT: Denies headaches, ear pain, changes in vision, rhinorrhea, sore throat CV: Denies CP, palpitations, SOB, orthopnea Pulm: Denies SOB, cough, wheezing GI: Denies abdominal pain, nausea, vomiting, diarrhea, constipation GU: Denies dysuria, hematuria, frequency, vaginal discharge Msk: Denies muscle cramps, joint pains Neuro: Denies weakness, numbness, tingling Skin: Denies rashes, bruising Psych: Denies depression, anxiety, hallucinations    Objective:    Blood pressure 130/82, pulse 61, temperature 97.8 F (36.6 C), temperature source Temporal, weight 178 lb 12.8 oz (81.1 kg), last menstrual period 07/12/1999, SpO2 97 %.   Gen. Pleasant, well-nourished, in no distress, normal affect   HEENT: [place]/AT, face symmetric, no scleral icterus, PERRLA, EOMI, nares patent without  drainage Lungs: no accessory muscle use Cardiovascular: RRR, no peripheral edema Musculoskeletal: No deformities, no cyanosis or clubbing, normal tone Neuro:  A&Ox3, CN II-XII intact, normal gait Skin:  Warm, no lesions/ rash  Wt Readings from Last 3 Encounters:  05/22/19 178 lb 12.8 oz (81.1 kg)  05/15/19 176 lb 6.4 oz (80 kg)  01/14/19 179 lb (81.2 kg)    Lab Results  Component Value Date   WBC 4.3 04/06/2017   HGB 12.8 04/06/2017   HCT 40.2 04/06/2017   PLT 195.0 04/06/2017   GLUCOSE 91 04/06/2017   CHOL 195 04/06/2017   TRIG 130.0 04/06/2017   HDL 60.30 04/06/2017   LDLCALC 109 (H) 04/06/2017   ALT 11 04/06/2017   AST 15 04/06/2017   NA 141 04/06/2017   K 3.8 04/06/2017   CL 103 04/06/2017   CREATININE 0.74 04/06/2017   BUN 11 04/06/2017   CO2 32 04/06/2017   TSH 1.13 01/06/2016   HGBA1C 6.2 04/06/2017    Assessment/Plan:  Essential hypertension  -improving. -discussed goal <130/80 -Pt is advised to be consistent with lifestyle modifications, weight loss, etc as she is hoping to discontinue medication. -For now continue lisinopril 40 mg daily. -Pt encouraged to check blood pressure regularly at home and keep a log. -Discussed follow-up in the next few months - Plan: lisinopril (ZESTRIL) 40 MG tablet  F/u in 2 months, sooner if needed  Grier Mitts, MD

## 2019-05-22 NOTE — Telephone Encounter (Signed)
lvm for pt to call back to discuss going to virtual due to having sinus issues

## 2019-07-24 ENCOUNTER — Ambulatory Visit: Payer: BC Managed Care – PPO | Admitting: Family Medicine

## 2019-09-03 ENCOUNTER — Encounter: Payer: Self-pay | Admitting: Certified Nurse Midwife

## 2019-10-17 ENCOUNTER — Other Ambulatory Visit: Payer: Self-pay

## 2019-10-17 ENCOUNTER — Ambulatory Visit (INDEPENDENT_AMBULATORY_CARE_PROVIDER_SITE_OTHER): Payer: BC Managed Care – PPO | Admitting: Family Medicine

## 2019-10-17 ENCOUNTER — Encounter: Payer: Self-pay | Admitting: Family Medicine

## 2019-10-17 VITALS — BP 130/72 | HR 95 | Temp 97.8°F | Wt 168.2 lb

## 2019-10-17 DIAGNOSIS — L02512 Cutaneous abscess of left hand: Secondary | ICD-10-CM

## 2019-10-17 MED ORDER — DOXYCYCLINE HYCLATE 100 MG PO CAPS: 100.0000 mg | ORAL_CAPSULE | Freq: Two times a day (BID) | ORAL | 0 refills | Status: DC

## 2019-10-17 NOTE — Progress Notes (Signed)
  Subjective:     Patient ID: Cindy Zimmerman, female   DOB: 20-Dec-1965, 54 y.o.   MRN: [id number]  HPI Cindy Zimmerman is seen with severe pain and swelling of her left middle finger.  She first noticed some soreness on Wednesday.  No known injury.  She has had progressive pain and swelling since then.  She has had no fever.  No known history of MRSA.  Her pain has been severe at times.  She has been unable to do things like gripping because of the pain.  She works as a Pharmacist, hospital with fourth graders  Past Medical History:  Diagnosis Date  . Abnormal Pap smear of cervix   . Anemia    due to fibroid tumors  . Fibroid   . Hypertension   . Leiomyoma   . Urticaria    fish   Past Surgical History:  Procedure Laterality Date  . ABDOMINAL HYSTERECTOMY     TAH/LOA  . CHOLECYSTECTOMY    . COLONOSCOPY    . HYSTEROSCOPY  4/00   fibroid hysteroscopic resection  . MYOMECTOMY  7/96   multiple  . ROBOTIC ASSISTED LAPAROSCOPIC VAGINAL HYSTERECTOMY WITH FIBROID REMOVAL      reports that she has never smoked. She has never used smokeless tobacco. She reports that she does not drink alcohol or use drugs. family history includes Cancer in her paternal grandmother and paternal uncle; Diabetes in her father and paternal aunt; Hypertension in her father and mother; Thyroid disease in her mother. Allergies  Allergen Reactions  . Fish Allergy Anaphylaxis and Hives  . Strawberry (Diagnostic) Anaphylaxis, Swelling and Other (See Comments)    Eyes swell  . Tamiflu [Oseltamivir] Hives     Review of Systems  Constitutional: Negative for chills and fever.       Objective:   Physical Exam Vitals reviewed.  Constitutional:      Appearance: Normal appearance.  Skin:    Comments: Left middle finger reveals swelling distally along the border bordering with the fourth digit.  She has swelling and fluctuance at the base of the nail extending to the lateral margin.  There is some mild erythema and this is very  tender to palpation.  Neurological:     Mental Status: She is alert.        Assessment:     Abscess left middle finger    Plan:     -We recommended incision and drainage of abscess.  We discussed risk including pain, bleeding, scarring.  Patient consented.  We performed digital block with 1% plain Xylocaine using 25-gauge 5/8 inch needle.  Patient tolerated well.  After achieving full block of the left middle finger we prepped with Betadine.  Using #11 blade made a soft linear incision approximately 1 cm in length along the area of maximal fluctuance.  Patient had drainage of copious amount of pus.  We used some curved hemostats to explore the wound cavity until it pus had fully drained.  She had some as expected mild bleeding which was controlled with pressure. Dressing applied -Culture obtained -Start doxycycline 100 mg twice daily for 10 days pending culture -Elevate hand frequently -Remove dressing tomorrow and start warm salt water soaks twice daily -Follow-up promptly for any fever, progressive swelling, or other concerns -We recommend that she consider setting up follow-up early next week to reassess and sooner as needed  Eulas Post MD [facility]

## 2019-10-17 NOTE — Patient Instructions (Signed)
Start the antibiotic tonight  Start tomorrow with salt water soaks 15-20 minutes two times daily until healed  Elevate tonight

## 2019-10-20 LAB — WOUND CULTURE
MICRO NUMBER:: 10452694
SPECIMEN QUALITY:: ADEQUATE

## 2019-11-17 ENCOUNTER — Ambulatory Visit (INDEPENDENT_AMBULATORY_CARE_PROVIDER_SITE_OTHER): Payer: BC Managed Care – PPO | Admitting: Family Medicine

## 2019-11-17 ENCOUNTER — Other Ambulatory Visit: Payer: Self-pay

## 2019-11-17 ENCOUNTER — Encounter: Payer: Self-pay | Admitting: Family Medicine

## 2019-11-17 VITALS — BP 136/86 | HR 82 | Temp 97.8°F | Wt 173.0 lb

## 2019-11-17 DIAGNOSIS — H01001 Unspecified blepharitis right upper eyelid: Secondary | ICD-10-CM | POA: Diagnosis not present

## 2019-11-17 MED ORDER — CETIRIZINE HCL 10 MG PO TABS: 10.0000 mg | ORAL_TABLET | Freq: Every day | ORAL | 1 refills | Status: DC

## 2019-11-17 MED ORDER — BACITRACIN-POLYMYXIN B 500-10000 UNIT/GM OP OINT: 1.0000 "application " | TOPICAL_OINTMENT | Freq: Two times a day (BID) | OPHTHALMIC | 0 refills | Status: AC

## 2019-11-17 NOTE — Progress Notes (Signed)
Subjective:    Patient ID: Cindy Zimmerman, female    DOB: 02-18-1966, 54 y.o.   MRN: [id number]  No chief complaint on file.   HPI Patient was seen today for acute concern.  Pt with right eyelid  pain and edema.  Pt notes symptoms started after being outside on Saturday.  Pt denies h/o seasonal allergies.  Has not noticed any bumps on eyelid, changes in vision, crusting, scratches.  Past Medical History:  Diagnosis Date  . Abnormal Pap smear of cervix   . Anemia    due to fibroid tumors  . Fibroid   . Hypertension   . Leiomyoma   . Urticaria    fish    Allergies  Allergen Reactions  . Fish Allergy Anaphylaxis and Hives  . Strawberry (Diagnostic) Anaphylaxis, Swelling and Other (See Comments)    Eyes swell  . Tamiflu [Oseltamivir] Hives    ROS General: Denies fever, chills, night sweats, changes in weight, changes in appetite HEENT: Denies headaches, ear pain, changes in vision, rhinorrhea, sore throat  +R upper eyelid edema and discomfort CV: Denies CP, palpitations, SOB, orthopnea Pulm: Denies SOB, cough, wheezing GI: Denies abdominal pain, nausea, vomiting, diarrhea, constipation GU: Denies dysuria, hematuria, frequency, vaginal discharge Msk: Denies muscle cramps, joint pains Neuro: Denies weakness, numbness, tingling Skin: Denies rashes, bruising Psych: Denies depression, anxiety, hallucinations    Objective:    Blood pressure 136/86, pulse 82, temperature 97.8 F (36.6 C), temperature source Temporal, weight 173 lb (78.5 kg), last menstrual period 07/12/1999, SpO2 98 %.  Gen. Pleasant, well-nourished, in no distress, normal affect   HEENT: [place]/AT, face symmetric, 2 small bumps on conjunctival surface of medial R upper eyelid, R upper eyelid edema, conjunctiva clear b/l, no scleral icterus, PERRLA, EOMI, nares patent without drainage. Lungs: no accessory muscle use Cardiovascular: RRR, no peripheral edema Neuro:  A&Ox3, CN II-XII intact, normal gait Skin:  Warm,  no lesions/ rash.  R upper eyelid edema with 2 small bumps medially.   Wt Readings from Last 3 Encounters:  10/17/19 168 lb 3.2 oz (76.3 kg)  05/22/19 178 lb 12.8 oz (81.1 kg)  05/15/19 176 lb 6.4 oz (80 kg)    Lab Results  Component Value Date   WBC 4.3 04/06/2017   HGB 12.8 04/06/2017   HCT 40.2 04/06/2017   PLT 195.0 04/06/2017   GLUCOSE 91 04/06/2017   CHOL 195 04/06/2017   TRIG 130.0 04/06/2017   HDL 60.30 04/06/2017   LDLCALC 109 (H) 04/06/2017   ALT 11 04/06/2017   AST 15 04/06/2017   NA 141 04/06/2017   K 3.8 04/06/2017   CL 103 04/06/2017   CREATININE 0.74 04/06/2017   BUN 11 04/06/2017   CO2 32 04/06/2017   TSH 1.13 01/06/2016   HGBA1C 6.2 04/06/2017    Assessment/Plan:  Blepharitis of right upper eyelid, unspecified type  -supportive care: warm compresses, gentle cleansing with baby shampoo -zyrtec prn for pruritis and possible allergic component -given handout -f/u with Ophthalmology for continued or worsened sypmtoms - Plan: bacitracin-polymyxin b (POLYSPORIN) ophthalmic ointment, Zyrtec  F/u prn  Grier Mitts, MD

## 2019-11-17 NOTE — Patient Instructions (Signed)
Blepharitis °Blepharitis is inflammation of the eyelids. Blepharitis may happen with: °· Reddish, scaly skin around the scalp and eyebrows. °· Burning or itching of the eyelids. °· Eye discharge at night that causes the eyelashes to stick together in the morning. °· Eyelashes that fall out. °· Sensitivity to light. °Follow these instructions at home: °Pay attention to any changes in how your eyes look or feel. Tell your health care provider about any changes. Follow these instructions to help with your condition. °Keeping Clean ° °· Wash your hands often. °· Wash your eyelids with warm water or with warm water that is mixed with a small amount of baby shampoo. Do this two times per day or as often as needed. °· Wash your face and eyebrows at least once a day. °· Use a clean towel each time you dry your eyelids. Do not use this towel to clean or dry other areas of your body. Do not share your towel with anyone. °General instructions °· Avoid wearing makeup until you get better. Do not share makeup with anyone. °· Avoid rubbing your eyes. °· Apply warm compresses to your eyes 2 times per day for 10 minutes at a time, or as told by your health care provider. °· If you were prescribed an antibiotic ointment or steroid drops, apply or use the medicine as told by your health care provider. Do not stop using the medicine even if you feel better. °· Keep all follow-up visits as told by your health care provider. This is important. °Contact a health care provider if: °· Your eyelids feel hot. °· You have blisters or a rash on your eyelids. °· The condition does not go away in 2-4 days. °· The inflammation gets worse. °Get help right away if: °· You have pain or redness that gets worse or spreads to other parts of your face. °· Your vision changes. °· You have pain when looking at lights or moving objects. °· You have a fever. °Summary °· Blepharitis is inflammation of the eyelids. °· Pay attention to any changes in how your  eyes look or feel. Tell your health care provider about any changes. °· Follow home care instructions as told by your health care provider. Wash your hands often. Avoid wearing makeup. Do not rub your eyes. °· To treat this condition, use warm compresses and prescription ointments or eye drops. °· Let your health care provider know if you have vision changes, blisters or rash on eyelids, pain that spreads to your face, or warmth on your eyelids. °This information is not intended to replace advice given to you by your health care provider. Make sure you discuss any questions you have with your health care provider. °Document Revised: 11/19/2017 Document Reviewed: 11/19/2017 °Elsevier Patient Education © 2020 [employer] ° °

## 2019-11-20 ENCOUNTER — Encounter: Payer: Self-pay | Admitting: Family Medicine

## 2019-12-09 ENCOUNTER — Other Ambulatory Visit: Payer: Self-pay | Admitting: Family Medicine

## 2019-12-11 ENCOUNTER — Ambulatory Visit: Payer: BC Managed Care – PPO | Admitting: Allergy

## 2020-01-02 ENCOUNTER — Ambulatory Visit: Payer: BC Managed Care – PPO | Admitting: Allergy

## 2020-01-02 ENCOUNTER — Encounter: Payer: Self-pay | Admitting: Allergy

## 2020-01-02 ENCOUNTER — Other Ambulatory Visit: Payer: Self-pay

## 2020-01-02 VITALS — BP 118/72 | HR 79 | Temp 98.3°F | Resp 16 | Wt 173.0 lb

## 2020-01-02 DIAGNOSIS — J3089 Other allergic rhinitis: Secondary | ICD-10-CM | POA: Diagnosis not present

## 2020-01-02 DIAGNOSIS — H1013 Acute atopic conjunctivitis, bilateral: Secondary | ICD-10-CM

## 2020-01-02 DIAGNOSIS — T7800XD Anaphylactic reaction due to unspecified food, subsequent encounter: Secondary | ICD-10-CM

## 2020-01-02 DIAGNOSIS — T7800XA Anaphylactic reaction due to unspecified food, initial encounter: Secondary | ICD-10-CM

## 2020-01-02 MED ORDER — EPINEPHRINE 0.3 MG/0.3ML IJ SOAJ: 0.3000 mg | INTRAMUSCULAR | 0 refills | Status: DC | PRN

## 2020-01-02 NOTE — Progress Notes (Signed)
Follow-up Note  RE: Cindy Zimmerman MRN: [id number] DOB: [birth year] Date of Office Visit: 01/02/2020   History of present illness: Cindy Zimmerman is a 54 y.o. female presenting today for follow-up of allergic rhinitis and food allergy. She was last seen on 05/15/2019.  History and physical obtained by Dr. Iona Beard, [facility] resident and reviewed/edited by myself.   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 She rarely has symptoms of nasal congestion and watery eyes even when outside for prolonged periods. She occasionally uses benadryl, but has not needed her eyedrops or nasal sprays.  She continues to avoid all fish, shellfish, and strawberry. She last needed her epinephrine device about 1 month ago during a cookout with fish near her house. She noticed her eyes were watering and throat tightening that improved after using her epinephrine. She has been feeling well since and has not needed epinephrine since then.   Review of systems: Review of Systems  Constitutional: Negative.   HENT: Negative.   Eyes: Negative.   Cardiovascular: Negative.   Gastrointestinal: Negative.   Musculoskeletal: Negative.   Skin: Negative.   Neurological: Negative.     All other systems negative unless noted above in HPI  Past medical/social/surgical/family history have been reviewed and are unchanged unless specifically indicated below.  No changes  Medication List: Current Outpatient Medications  Medication Sig Dispense Refill  . cetirizine (ZYRTEC) 10 MG tablet TAKE 1 TABLET BY MOUTH EVERY DAY 30 tablet 1  . EPINEPHrine (AUVI-Q) 0.3 mg/0.3 mL IJ SOAJ injection Inject 0.3 mLs (0.3 mg total) into the muscle as needed for anaphylaxis. 1 each 0  . fluticasone (FLONASE) 50 MCG/ACT nasal spray PLACE 1 SPRAY INTO BOTH NOSTRILS DAILY. 16 mL 1  . lisinopril (ZESTRIL) 40 MG tablet Take 1 tablet  (40 mg total) by mouth daily. 90 tablet 3  . Multiple Vitamins-Minerals (MULTIVITAMIN PO) Take 1 tablet by mouth.     . neomycin-polymyxin-dexamethasone (MAXITROL) 0.1 % ophthalmic suspension neomycin-polymyxin-dexameth 3.5 mg/mL-10,000 unit/mL-0.1% eye drops    . sodium chloride (OCEAN) 0.65 % nasal spray sodium chloride 0.65 % nasal spray aerosol  USE 3-4 TIMES PER DAY STARTING DAY AFTER SURGERY AND CONTINUE FOR ONE MONTH     No current facility-administered medications for this visit.     Known medication allergies: Allergies  Allergen Reactions  . Fish Allergy Anaphylaxis and Hives  . Strawberry (Diagnostic) Anaphylaxis, Swelling and Other (See Comments)    Eyes swell  . Tamiflu [Oseltamivir] Hives     Physical examination: Blood pressure 118/72, pulse 79, temperature 98.3 F (36.8 C), temperature source Temporal, resp. rate 16, weight 173 lb (78.5 kg), last menstrual period 07/12/1999, SpO2 98 %.  General: Alert, interactive, in no acute distress. HEENT: TMs pearly gray, turbinates non-edematous without discharge, post-pharynx non erythematous. Neck: Supple without lymphadenopathy. Lungs: Clear to auscultation without wheezing, rhonchi or rales. {no increased work of breathing. CV: Normal S1, S2 without murmurs. Abdomen: Nondistended, nontender. Skin: Warm and dry, without lesions or rashes. Extremities:  No clubbing, cyanosis or edema. Neuro:   Grossly intact.  Diagnositics/Labs: None today   Assessment and plan:   Allergic rhinitis with conjunctivitis   - continue avoidance measures for weed pollen, tree pollen and molds   - if needed continue use of Allegra 180mg , Zyrtec 10mg  or Xyzal 5mg  daily    - for itchy/watery/eyes can  use of allergy eyedrops like Pataday which is over-the-counter.      - if needed for congestion/drainage use Flonase 2 sprays each nostril daily for 1-2 weeks at a time before stopping.     Food allergy    - continue avoidance of all  fish/shellfish and strawberry    - have access to self-injectable epinephrine  AuviQ 0.3mg  at all times    - follow emergency action plan in case of allergic reaction  Follow- up 6-12 months or sooner if needed  I appreciate the opportunity to take part in Cindy Zimmerman's care. Please do not hesitate to contact me with questions.  Sincerely,   Prudy Feeler, MD Allergy/Immunology [facility]

## 2020-01-02 NOTE — Patient Instructions (Signed)
Allergic rhinitis with conjunctivitis   - continue avoidance measures for weed pollen, tree pollen and molds   - if needed continue use of Allegra 180mg , Zyrtec 10mg  or Xyzal 5mg  daily    - for itchy/watery/eyes can use of allergy eyedrops like Pataday which is over-the-counter.      - if needed for congestion/drainage use Flonase 2 sprays each nostril daily for 1-2 weeks at a time before stopping.     Food allergy    - continue avoidance of all fish/shellfish and strawberry    - have access to self-injectable epinephrine  AuviQ 0.3mg  at all times    - follow emergency action plan in case of allergic reaction  Follow- up 6-12 months or sooner if needed

## 2020-01-16 ENCOUNTER — Ambulatory Visit: Payer: BC Managed Care – PPO | Admitting: Certified Nurse Midwife

## 2020-01-21 ENCOUNTER — Ambulatory Visit: Payer: BC Managed Care – PPO | Admitting: Obstetrics and Gynecology

## 2020-01-21 ENCOUNTER — Other Ambulatory Visit: Payer: Self-pay

## 2020-01-21 ENCOUNTER — Encounter: Payer: Self-pay | Admitting: Obstetrics and Gynecology

## 2020-01-21 VITALS — BP 136/70 | HR 83 | Ht 59.75 in | Wt 172.0 lb

## 2020-01-21 DIAGNOSIS — Z833 Family history of diabetes mellitus: Secondary | ICD-10-CM | POA: Diagnosis not present

## 2020-01-21 DIAGNOSIS — Z01419 Encounter for gynecological examination (general) (routine) without abnormal findings: Secondary | ICD-10-CM | POA: Diagnosis not present

## 2020-01-21 DIAGNOSIS — Z0000 Encounter for general adult medical examination without abnormal findings: Secondary | ICD-10-CM

## 2020-01-21 DIAGNOSIS — Z6833 Body mass index (BMI) 33.0-33.9, adult: Secondary | ICD-10-CM | POA: Diagnosis not present

## 2020-01-21 DIAGNOSIS — L309 Dermatitis, unspecified: Secondary | ICD-10-CM

## 2020-01-21 DIAGNOSIS — N898 Other specified noninflammatory disorders of vagina: Secondary | ICD-10-CM

## 2020-01-21 DIAGNOSIS — E559 Vitamin D deficiency, unspecified: Secondary | ICD-10-CM

## 2020-01-21 MED ORDER — BETAMETHASONE VALERATE 0.1 % EX OINT: 1.0000 "application " | TOPICAL_OINTMENT | Freq: Two times a day (BID) | CUTANEOUS | 0 refills | Status: DC

## 2020-01-21 NOTE — Progress Notes (Signed)
54 y.o. G0P0000 Single Black or African American Not Hispanic or Latino female here for annual exam.  H/O hysterectomy. Patient states that she is not feeling "comfortable" in her rectum area. She states that she felt raw when she got home. She is having vaginal discharge and doesn't feel clean.  The skin around her anus is very uncomfortable for the last 2 weeks, just doesn't feel clean. She used a wipe and used neosporin. She thinks the toilet paper at work.  She has frequent BM's, every time she eats, 2-3 BM's a day, soft.  She c/o some increase vaginal discharge, no itching, burning or irritation.  No bladder c/o.  Not sexually active in the last 10 years. Interested in dating.     She has had her covid vaccinations.   Patient's last menstrual period was 07/12/1999 (exact date).          Sexually active: No.  The current method of family planning is post hysterectomy status.    Exercising: No.  The patient does not participate in regular exercise at present. Smoker:  no  Health Maintenance: Pap:  12/04/08 neg  History of abnormal Pap:  Yes, follow up was normal.  MMG:  04/05/19 Bi-rads 1 neg Density  B BMD:  March 2018  Colonoscopy: 02/16/2017 poiyps TDaP:  2013   reports that she has never smoked. She has never used smokeless tobacco. She reports that she does not drink alcohol and does not use drugs. 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   Past Medical History:  Diagnosis Date  . Abnormal Pap smear of cervix   . Anemia    due to fibroid tumors  . Fibroid   . Hypertension   . Leiomyoma   . Urticaria    fish    Past Surgical History:  Procedure Laterality Date  . ABDOMINAL HYSTERECTOMY     TAH/LOA  . CHOLECYSTECTOMY    . COLONOSCOPY    . HYSTEROSCOPY  4/00   fibroid hysteroscopic resection  . MYOMECTOMY  7/96   multiple  . ROBOTIC ASSISTED LAPAROSCOPIC VAGINAL HYSTERECTOMY WITH FIBROID REMOVAL      Current Outpatient Medications  Medication Sig Dispense Refill  . cetirizine (ZYRTEC)  10 MG tablet TAKE 1 TABLET BY MOUTH EVERY DAY 30 tablet 1  . EPINEPHrine (AUVI-Q) 0.3 mg/0.3 mL IJ SOAJ injection Inject 0.3 mLs (0.3 mg total) into the muscle as needed for anaphylaxis. 1 each 0  . fluticasone (FLONASE) 50 MCG/ACT nasal spray PLACE 1 SPRAY INTO BOTH NOSTRILS DAILY. 16 mL 1  . lisinopril (ZESTRIL) 40 MG tablet Take 1 tablet (40 mg total) by mouth daily. 90 tablet 3  . Multiple Vitamin (MULTIVITAMIN) tablet Take 1 tablet by mouth daily.     No current facility-administered medications for this visit.    Family History  Problem Relation Age of Onset  . Hypertension Mother   . Thyroid disease Mother   . Hypertension Father   . Diabetes Father   . Diabetes Paternal Aunt   . Cancer Paternal Uncle        bone  . Cancer Paternal Grandmother        bone  . Allergic rhinitis Neg Hx   . Asthma Neg Hx   . Eczema Neg Hx   . Urticaria Neg Hx     Review of Systems  Genitourinary: Positive for vaginal discharge.  All other systems reviewed and are negative.   Exam:   BP 136/70   Pulse 83   Ht 4' 11.75" (  1.518 m)   Wt 172 lb (78 kg)   LMP 07/12/1999 (Exact Date)   SpO2 98%   BMI 33.87 kg/m   Weight change: @WEIGHTCHANGE @ Height:   Height: 4' 11.75" (151.8 cm)  Ht Readings from Last 3 Encounters:  01/21/20 4' 11.75" (1.518 m)  05/15/19 5' (1.524 m)  01/14/19 5' 0.25" (1.53 m)    General appearance: alert, cooperative and appears stated age Head: Normocephalic, without obvious abnormality, atraumatic Neck: no adenopathy, supple, symmetrical, trachea midline and thyroid normal to inspection and palpation Lungs: clear to auscultation bilaterally Cardiovascular: regular rate and rhythm Breasts: normal appearance, no masses or tenderness Abdomen: soft, non-tender; non distended,  no masses,  no organomegaly Extremities: extremities normal, atraumatic, no cyanosis or edema Skin: Skin color, texture, turgor normal. No rashes or lesions Lymph nodes: Cervical,  supraclavicular, and axillary nodes normal. No abnormal inguinal nodes palpated Neurologic: Grossly normal   Pelvic: External genitalia:  no lesions              Urethra:  normal appearing urethra with no masses, tenderness or lesions              Bartholins and Skenes: normal                 Vagina: normal appearing vagina with normal color and discharge, no lesions              Cervix: absent               Bimanual Exam:  Uterus:  uterus absent              Adnexa: no mass, fullness, tenderness               Rectovaginal: Confirms               Anus:  normal sphincter tone, no lesions, small perianal skin tag noted, mild perianal irritation  Karmen Bongo chaperoned for the exam.  Wet prep: no clue, no trich, + wbc KOH: no yeast PH: 4.5   A:  Well Woman with normal exam  Perianal dermatitis   Vaginal discharge, normal exam, negative vaginal slides. Patient reasurred  Vit d def  FH of DM  BMI 33  P:   No pap needed  Screening labs, vit d, HgbA1C, TSH  Discussed breast self exam  Discussed calcium and vit D intake  Mammogram due this October  Colonscopy  Discussed vulvar and perianal skin care, information on perianal dermatitis given  Steroid ointment for short term use  Use Vaseline as needed  Call if not improved

## 2020-01-21 NOTE — Patient Instructions (Addendum)
EXERCISE AND DIET:  We recommended that you start or continue a regular exercise program for good health. Regular exercise means any activity that makes your heart beat faster and makes you sweat.  We recommend exercising at least 30 minutes per day at least 3 days a week, preferably 4 or 5.  We also recommend a diet low in fat and sugar.  Inactivity, poor dietary choices and obesity can cause diabetes, heart attack, stroke, and kidney damage, among others.    ALCOHOL AND SMOKING:  Women should limit their alcohol intake to no more than 7 drinks/beers/glasses of wine (combined, not each!) per week. Moderation of alcohol intake to this level decreases your risk of breast cancer and liver damage. And of course, no recreational drugs are part of a healthy lifestyle.  And absolutely no smoking or even second hand smoke. Most people know smoking can cause heart and lung diseases, but did you know it also contributes to weakening of your bones? Aging of your skin?  Yellowing of your teeth and nails?  CALCIUM AND VITAMIN D:  Adequate intake of calcium and Vitamin D are recommended.  The recommendations for exact amounts of these supplements seem to change often, but generally speaking 1,200 mg of calcium (between diet and supplement) and 800 units of Vitamin D per day seems prudent. Certain women may benefit from higher intake of Vitamin D.  If you are among these women, your doctor will have told you during your visit.    PAP SMEARS:  Pap smears, to check for cervical cancer or precancers,  have traditionally been done yearly, although recent scientific advances have shown that most women can have pap smears less often.  However, every woman still should have a physical exam from her gynecologist every year. It will include a breast check, inspection of the vulva and vagina to check for abnormal growths or skin changes, a visual exam of the cervix, and then an exam to evaluate the size and shape of the uterus and  ovaries.  And after 55 years of age, a rectal exam is indicated to check for rectal cancers. We will also provide age appropriate advice regarding health maintenance, like when you should have certain vaccines, screening for sexually transmitted diseases, bone density testing, colonoscopy, mammograms, etc.   MAMMOGRAMS:  All women over 40 years old should have a yearly mammogram. Many facilities now offer a "3D" mammogram, which may cost around $50 extra out of pocket. If possible,  we recommend you accept the option to have the 3D mammogram performed.  It both reduces the number of women who will be called back for extra views which then turn out to be normal, and it is better than the routine mammogram at detecting truly abnormal areas.    COLON CANCER SCREENING: Now recommend starting at age 45. At this time colonoscopy is not covered for routine screening until 50. There are take home tests that can be done between 45-49.   COLONOSCOPY:  Colonoscopy to screen for colon cancer is recommended for all women at age 50.  We know, you hate the idea of the prep.  We agree, BUT, having colon cancer and not knowing it is worse!!  Colon cancer so often starts as a polyp that can be seen and removed at colonscopy, which can quite literally save your life!  And if your first colonoscopy is normal and you have no family history of colon cancer, most women don't have to have it again for   10 years.  Once every ten years, you can do something that may end up saving your life, right?  We will be happy to help you get it scheduled when you are ready.  Be sure to check your insurance coverage so you understand how much it will cost.  It may be covered as a preventative service at no cost, but you should check your particular policy.      Breast Self-Awareness Breast self-awareness means being familiar with how your breasts look and feel. It involves checking your breasts regularly and reporting any changes to your  health care provider. Practicing breast self-awareness is important. A change in your breasts can be a sign of a serious medical problem. Being familiar with how your breasts look and feel allows you to find any problems early, when treatment is more likely to be successful. All women should practice breast self-awareness, including women who have had breast implants. How to do a breast self-exam One way to learn what is normal for your breasts and whether your breasts are changing is to do a breast self-exam. To do a breast self-exam: Look for Changes  1. Remove all the clothing above your waist. 2. Stand in front of a mirror in a room with good lighting. 3. Put your hands on your hips. 4. Push your hands firmly downward. 5. Compare your breasts in the mirror. Look for differences between them (asymmetry), such as: ? Differences in shape. ? Differences in size. ? Puckers, dips, and bumps in one breast and not the other. 6. Look at each breast for changes in your skin, such as: ? Redness. ? Scaly areas. 7. Look for changes in your nipples, such as: ? Discharge. ? Bleeding. ? Dimpling. ? Redness. ? A change in position. Feel for Changes Carefully feel your breasts for lumps and changes. It is best to do this while lying on your back on the floor and again while sitting or standing in the shower or tub with soapy water on your skin. Feel each breast in the following way:  Place the arm on the side of the breast you are examining above your head.  Feel your breast with the other hand.  Start in the nipple area and make  inch (2 cm) overlapping circles to feel your breast. Use the pads of your three middle fingers to do this. Apply light pressure, then medium pressure, then firm pressure. The light pressure will allow you to feel the tissue closest to the skin. The medium pressure will allow you to feel the tissue that is a little deeper. The firm pressure will allow you to feel the tissue  close to the ribs.  Continue the overlapping circles, moving downward over the breast until you feel your ribs below your breast.  Move one finger-width toward the center of the body. Continue to use the  inch (2 cm) overlapping circles to feel your breast as you move slowly up toward your collarbone.  Continue the up and down exam using all three pressures until you reach your armpit.  Write Down What You Find  Write down what is normal for each breast and any changes that you find. Keep a written record with breast changes or normal findings for each breast. By writing this information down, you do not need to depend only on memory for size, tenderness, or location. Write down where you are in your menstrual cycle, if you are still menstruating. If you are having trouble noticing differences   in your breasts, do not get discouraged. With time you will become more familiar with the variations in your breasts and more comfortable with the exam. How often should I examine my breasts? Examine your breasts every month. If you are breastfeeding, the best time to examine your breasts is after a feeding or after using a breast pump. If you menstruate, the best time to examine your breasts is 5-7 days after your period is over. During your period, your breasts are lumpier, and it may be more difficult to notice changes. When should I see my health care provider? See your health care provider if you notice:  A change in shape or size of your breasts or nipples.  A change in the skin of your breast or nipples, such as a reddened or scaly area.  Unusual discharge from your nipples.  A lump or thick area that was not there before.  Pain in your breasts.  Anything that concerns you.   Perianal Dermatitis, Adult Perianal dermatitis is inflammation of the skin around the anus. This condition causes patches of red, irritated skin that may be itchy or painful. It can be passed from one person to  another (contagious), depending on what caused it. What are the causes? This condition may be caused by:  Contact with an irritant, such as stool, urine, mucus, soap, or sweat.  Bacteria, especially certain types of strep (streptococcal) bacteria.  Hemorrhoids.  Fungus.  Skin conditions, such as psoriasis.  Medical conditions, such as diabetes and Crohn disease.  Certain foods.  An opening between the rectum and the skin around the anus (fistula). What increases the risk? This condition is more likely to develop in:  People who are unable to control when they go to the bathroom (have incontinence).  People who have trouble walking or moving around (mobility problems).  People with thin or fragile skin.  People taking oral antibiotic medicines or steroids. What are the signs or symptoms? Itchy, red patches of skin are the main symptom of this condition. The skin in this area may also be swollen and have:  Cracks or tears (fissures).  Small, raised bumps (papules).  Small, pus-filled blisters (pustules). Other symptoms include:  Pain when passing stools.  Blood in the stool.  Itching around the anus.  Tenderness around the anus.  Holding back stools to avoid pain (constipation). How is this diagnosed? This condition is diagnosed with a medical history and physical exam. The diagnosis is confirmed by testing a sample of the skin for bacteria or fungus. How is this treated? Treatment for this condition depends on the cause. Mild cases may go away without treatment when contact with the irritant is stopped. Treatment for more severe cases of perianal dermatitis may include medicines, such as:  A waterproof barrier cream.  Topical or oral corticosteroids to ease skin inflammation.  Antihistamines to relieve itching.  Antibiotics to treat a bacterial infection.  Antifungal cream to treat a fungal infection. Severe fungal infections may also be treated with  topical steroids and medicine to control itching. Follow these instructions at home:   Take over-the-counter and prescription medicines only as told by your health care provider.  Apply prescribed or suggested creams only as told by your health care provider.  If you were prescribed an antibiotic, take it as told by your health care provider. Do not stop taking the antibiotic even if your condition improves.  Do not scratch the affected area.  Wash your hands carefully with soap  and warm water after each time that you use the bathroom. If soap and water are not available, use hand sanitizer. Make sure other people in your household wash their hands frequently as well.  Keep all follow-up visits as told by your health care provider. This is important. How is this prevented?  Avoid contact with any substances that cause perianal inflammation.  Keep the perianal area clean and dry. Contact a health care provider if:  Your symptoms do not improve with treatment.  Your symptoms get worse.  Your symptoms go away and then return.  You are not able to control your bowel movements or you are leaking stool (have fecal incontinence).  You have a fever. Get help right away if:  You frequently pass blood in your stools.  You lose weight and you do not know why.  You have fluid, blood, or pus coming from the rash site. Summary  Perianal dermatitis is inflammation of the skin around the anus. This condition causes patches of red, irritated skin that may be itchy or painful.  Itchy, red patches of skin are the main symptom of this condition.  Treatment for this condition depends on the cause. Mild cases may go away without treatment when contact with the irritant is stopped. Treatment for more severe cases of perianal dermatitis may include medicines.  Wash your hands carefully with soap and warm water after each time that you use the bathroom. If soap and water are not available, use  hand sanitizer.  Keep the perianal area clean and dry to prevent this condition. This information is not intended to replace advice given to you by your health care provider. Make sure you discuss any questions you have with your health care provider. Document Revised: 05/11/2017 Document Reviewed: 08/10/2016 Elsevier Patient Education  [place].

## 2020-01-22 LAB — COMPREHENSIVE METABOLIC PANEL
ALT: 15 IU/L (ref 0–32)
AST: 20 IU/L (ref 0–40)
Albumin/Globulin Ratio: 1.4 (ref 1.2–2.2)
Albumin: 4.1 g/dL (ref 3.8–4.9)
Alkaline Phosphatase: 58 IU/L (ref 48–121)
BUN/Creatinine Ratio: 11 (ref 9–23)
BUN: 8 mg/dL (ref 6–24)
Bilirubin Total: 0.3 mg/dL (ref 0.0–1.2)
CO2: 26 mmol/L (ref 20–29)
Calcium: 9.6 mg/dL (ref 8.7–10.2)
Chloride: 105 mmol/L (ref 96–106)
Creatinine, Ser: 0.74 mg/dL (ref 0.57–1.00)
GFR calc Af Amer: 105 mL/min/{1.73_m2} (ref >59)
GFR calc non Af Amer: 91 mL/min/{1.73_m2} (ref >59)
Globulin, Total: 3 g/dL (ref 1.5–4.5)
Glucose: 76 mg/dL (ref 65–99)
Potassium: 3.7 mmol/L (ref 3.5–5.2)
Sodium: 141 mmol/L (ref 134–144)
Total Protein: 7.1 g/dL (ref 6.0–8.5)

## 2020-01-22 LAB — CBC
Hematocrit: 39.1 % (ref 34.0–46.6)
Hemoglobin: 12.9 g/dL (ref 11.1–15.9)
MCH: 26.2 pg — ABNORMAL LOW (ref 26.6–33.0)
MCHC: 33 g/dL (ref 31.5–35.7)
MCV: 79 fL (ref 79–97)
Platelets: 221 10*3/uL (ref 150–450)
RBC: 4.93 x10E6/uL (ref 3.77–5.28)
RDW: 13.8 % (ref 11.7–15.4)
WBC: 4.2 10*3/uL (ref 3.4–10.8)

## 2020-01-22 LAB — LIPID PANEL
Chol/HDL Ratio: 3.5 ratio (ref 0.0–4.4)
Cholesterol, Total: 205 mg/dL — ABNORMAL HIGH (ref 100–199)
HDL: 59 mg/dL (ref >39)
LDL Chol Calc (NIH): 117 mg/dL — ABNORMAL HIGH (ref 0–99)
Triglycerides: 168 mg/dL — ABNORMAL HIGH (ref 0–149)
VLDL Cholesterol Cal: 29 mg/dL (ref 5–40)

## 2020-01-22 LAB — VITAMIN D 25 HYDROXY (VIT D DEFICIENCY, FRACTURES): Vit D, 25-Hydroxy: 16.6 ng/mL — ABNORMAL LOW (ref 30.0–100.0)

## 2020-01-22 LAB — HEMOGLOBIN A1C
Est. average glucose Bld gHb Est-mCnc: 123 mg/dL
Hgb A1c MFr Bld: 5.9 % — ABNORMAL HIGH (ref 4.8–5.6)

## 2020-01-22 LAB — TSH: TSH: 1.01 u[IU]/mL (ref 0.450–4.500)

## 2020-01-23 ENCOUNTER — Encounter: Payer: Self-pay | Admitting: Obstetrics and Gynecology

## 2020-01-23 DIAGNOSIS — R7303 Prediabetes: Secondary | ICD-10-CM

## 2020-01-23 HISTORY — DX: Prediabetes: R73.03

## 2020-04-14 ENCOUNTER — Other Ambulatory Visit: Payer: Self-pay | Admitting: *Deleted

## 2020-04-14 ENCOUNTER — Other Ambulatory Visit: Payer: Self-pay

## 2020-04-14 DIAGNOSIS — E559 Vitamin D deficiency, unspecified: Secondary | ICD-10-CM

## 2020-04-22 ENCOUNTER — Other Ambulatory Visit: Payer: Self-pay | Admitting: Obstetrics & Gynecology

## 2020-04-22 ENCOUNTER — Other Ambulatory Visit: Payer: Self-pay | Admitting: Physical Medicine and Rehabilitation

## 2020-04-22 DIAGNOSIS — Z1231 Encounter for screening mammogram for malignant neoplasm of breast: Secondary | ICD-10-CM

## 2020-05-04 ENCOUNTER — Ambulatory Visit
Admission: RE | Admit: 2020-05-04 | Discharge: 2020-05-04 | Disposition: A | Discharge status: Home / Self Care | Payer: BC Managed Care – PPO | Source: Ambulatory Visit | Attending: Obstetrics & Gynecology | Admitting: Obstetrics & Gynecology

## 2020-05-04 ENCOUNTER — Other Ambulatory Visit: Payer: Self-pay

## 2020-05-04 DIAGNOSIS — Z1231 Encounter for screening mammogram for malignant neoplasm of breast: Secondary | ICD-10-CM

## 2020-06-02 ENCOUNTER — Ambulatory Visit: Payer: BC Managed Care – PPO

## 2020-06-04 ENCOUNTER — Other Ambulatory Visit: Payer: Self-pay | Admitting: Family Medicine

## 2020-06-04 DIAGNOSIS — I10 Essential (primary) hypertension: Secondary | ICD-10-CM

## 2020-07-22 NOTE — Progress Notes (Signed)
GYNECOLOGY  VISIT  CC:   intermittent vaginal itching x 1.5 months  HPI: 55 y.o. G0P0000 Single Black or [place] American female here for vaginal pains, vaginal itching. Not sexually active Used a cleaning product from [place], this may be a contributing factor  Sometimes pain in felt in vagina, sharp, comes quickly and goes away quickly, going on about 1.5 months  Hx hyst (for fibroids), still has both ovaries    GYNECOLOGIC HISTORY: Patient's last menstrual period was 07/12/1999 (exact date). Contraception: hysterectomy Menopausal hormone therapy: none  Patient Active Problem List   Diagnosis Date Noted  . Prediabetes 01/23/2020  . Hypertensive disorder 04/06/2017  . Vitamin D deficiency 04/06/2017  . Iron deficiency anemia 04/06/2017    Past Medical History:  Diagnosis Date  . Abnormal Pap smear of cervix   . Anemia    due to fibroid tumors  . Fibroid   . Hypertension   . Leiomyoma   . Prediabetes 01/23/2020  . Urticaria    fish    Past Surgical History:  Procedure Laterality Date  . ABDOMINAL HYSTERECTOMY     TAH/LOA  . CHOLECYSTECTOMY    . COLONOSCOPY    . HYSTEROSCOPY  4/00   fibroid hysteroscopic resection  . MYOMECTOMY  7/96   multiple  . ROBOTIC ASSISTED LAPAROSCOPIC VAGINAL HYSTERECTOMY WITH FIBROID REMOVAL      MEDS:   Current Outpatient Medications on File Prior to Visit  Medication Sig Dispense Refill  . doxycycline (VIBRAMYCIN) 100 MG capsule Take 100 mg by mouth 2 (two) times daily.    Marland Kitchen lisinopril (ZESTRIL) 40 MG tablet TAKE 1 TABLET BY MOUTH EVERY DAY 90 tablet 3  . Multiple Vitamin (MULTIVITAMIN) tablet Take 1 tablet by mouth daily.    Marland Kitchen neomycin-polymyxin b-dexamethasone (MAXITROL) 3.5-10000-0.1 OINT     . EPINEPHrine (AUVI-Q) 0.3 mg/0.3 mL IJ SOAJ injection Inject 0.3 mLs (0.3 mg total) into the muscle as needed for anaphylaxis. (Patient not taking: Reported on 07/23/2020) 1 each 0   No current facility-administered medications  on file prior to visit.    ALLERGIES: Fish allergy, Strawberry (diagnostic), and Tamiflu [oseltamivir]  Family History  Problem Relation Age of Onset  . Hypertension Mother   . Thyroid disease Mother   . Hypertension Father   . Diabetes Father   . Diabetes Paternal Aunt   . Cancer Paternal Uncle        bone  . Cancer Paternal Grandmother        bone  . Allergic rhinitis Neg Hx   . Asthma Neg Hx   . Eczema Neg Hx   . Urticaria Neg Hx      Review of Systems  Constitutional: Negative.   HENT: Negative.   Eyes: Negative.   Respiratory: Negative.   Cardiovascular: Negative.   Gastrointestinal: Negative.   Endocrine: Negative.   Genitourinary: Negative for difficulty urinating, frequency and urgency.       Vaginal pain and vaginal itching   Musculoskeletal: Negative.   Skin: Negative.   Allergic/Immunologic: Negative.   Neurological: Negative.   Hematological: Negative.   Psychiatric/Behavioral: Negative.     PHYSICAL EXAMINATION:    BP 118/78   Pulse 78   Resp 16   Wt 169 lb (76.7 kg)   LMP 07/12/1999 (Exact Date)   BMI 33.28 kg/m     General appearance: alert, cooperative, no acute distress Lymph:  no inguinal LAD noted  Pelvic: External genitalia:  no lesions  Urethra:  normal appearing urethra with no masses, tenderness or lesions              Bartholins and Skenes: normal                 Vagina: normal appearing vagina, thin tissue, no redness, minimal discharge, white in color  Uterus: absent  Adnexa: no tenderness, no palpable mass  Rectal: no mass Unable to reproduce the pain that patient described in HPI.  Chaperone, Joy, CMA, was present for exam.  Assessment/Plan: Vaginal irritation - Plan: nystatin-triamcinolone ointment (MYCOLOG), Wet prep, genital Reassured wet mount normal, may be r/t loss of estrogen (GSM) Discussed use of coconut oil for vaginal lubrication  Discussed pre-diabetes may increase risk of yeast infection, but  did not see yeast today. Encouraged decrease sugar/carbs. Pt states she will be better with carbs and management of pre-diabetes when returns for annual in August!

## 2020-07-23 ENCOUNTER — Ambulatory Visit: Payer: BC Managed Care – PPO | Admitting: Nurse Practitioner

## 2020-07-23 ENCOUNTER — Other Ambulatory Visit: Payer: Self-pay

## 2020-07-23 ENCOUNTER — Encounter: Payer: Self-pay | Admitting: Nurse Practitioner

## 2020-07-23 VITALS — BP 118/78 | HR 78 | Resp 16 | Wt 169.0 lb

## 2020-07-23 DIAGNOSIS — N898 Other specified noninflammatory disorders of vagina: Secondary | ICD-10-CM

## 2020-07-23 LAB — WET PREP FOR TRICH, YEAST, CLUE

## 2020-07-23 MED ORDER — NYSTATIN-TRIAMCINOLONE 100000-0.1 UNIT/GM-% EX OINT: 1.0000 "application " | TOPICAL_OINTMENT | Freq: Two times a day (BID) | CUTANEOUS | 0 refills | Status: DC

## 2020-07-23 NOTE — Patient Instructions (Signed)
Preventing Type 2 Diabetes Mellitus Type 2 diabetes, also called type 2 diabetes mellitus, is a long-term (chronic) disease that affects sugar (glucose) levels in your blood. Normally, a hormone called insulin allows glucose to enter cells in your body. The cells use glucose for energy. With type 2 diabetes, you will have one or both of these problems:  Your pancreas does not make enough insulin.  Cells in your body do not respond properly to insulin that your body makes (insulin resistance). Insulin resistance or lack of insulin causes extra glucose to build up in the blood instead of going into cells. As a result, high blood glucose (hyperglycemia) develops. That can cause many complications. Being overweight or obese and having an inactive (sedentary) lifestyle can increase your risk for diabetes. Type 2 diabetes can be delayed or prevented by making certain nutrition and lifestyle changes. How can this condition affect me? If you do not take steps to prevent diabetes, your blood glucose levels may keep increasing over time. Too much glucose in your blood for a long time can damage your blood vessels, heart, kidneys, nerves, and eyes. Type 2 diabetes can lead to chronic health problems and complications, such as:  Heart disease.  Stroke.  Blindness.  Kidney disease.  Depression.  Poor circulation in your feet and legs. In severe cases, a foot or leg may need to be surgically removed (amputated). What can increase my risk? You may be more likely to develop type 2 diabetes if you:  Have type 2 diabetes in your family.  Are overweight or obese.  Have a sedentary lifestyle.  Have insulin resistance or a history of prediabetes.  Have a history of pregnancy-related (gestational) diabetes or polycystic ovary syndrome (PCOS). What actions can I take to prevent this? It can be difficult to recognize signs of type 2 diabetes. Taking action to prevent the disease before you develop  symptoms is the best way to avoid possible damage to your body. Making certain nutrition and lifestyle changes may prevent or delay the disease and related health problems. Nutrition  Eat healthy meals and snacks regularly. Do not skip meals. Fruit or a handful of nuts is a healthy snack between meals.  Drink water throughout the day. Avoid drinks that contain added sugar, such as soda or sweetened tea. Drink enough fluid to keep your urine pale yellow.  Follow instructions from your health care provider about eating or drinking restrictions.  Limit the amount of food you eat by: ? Controlling how much you eat at a time (portion size). ? Checking food labels for the serving sizes of food. ? Using a kitchen scale to weigh amounts of food.  Saut or steam food instead of frying it. Cook with water or broth instead of oils or butter.  Limit saturated fat and salt (sodium) in your diet. Have no more than 1 tsp (2,400 mg) of sodium a day. If you have heart disease or high blood pressure, use less than ? tsp (1,500 mg) of sodium a day.   Lifestyle  Lose weight if needed and as told. Your health care provider can determine how much weight loss is best for you and can help you lose weight safely.  If you are overweight or obese, you may be told to lose at least 5?7% of your body weight.  Manage blood pressure, cholesterol, and stress. Your health care provider will help determine the best treatment for you.  Do not use any products that contain nicotine or tobacco,   such as cigarettes, e-cigarettes, and chewing tobacco. If you need help quitting, ask your health care provider.   Activity  Do physical activity that makes your heart beat faster and makes you sweat (moderate intensity). Do this for at least 30 minutes on at least 5 days of the week, or as much as told by your health care provider.  Ask your health care provider what activities are safe for you. A mix of activities may be best,  such as walking, swimming, cycling, and strength training.  Try to add physical activity into your day. For example: ? Park your car farther away than usual so that you walk more. ? Take a walk during your lunch break. ? Use stairs instead of elevators or escalators. ? Walk or bike to work instead of driving.   Alcohol use If you drink alcohol:  Limit how much you use to: ? 0?1 drink a day for women who are not pregnant. ? 0?2 drinks a day for men.  Be aware of how much alcohol is in your drink. In the [place] one drink equals one 12 oz bottle of beer (355 mL), one 5 oz glass of wine (148 mL), or one 1 oz glass of hard liquor (44 mL). General information  Talk with your health care provider about your risk factors and how you can reduce your risk for diabetes.  Have your blood glucose tested regularly, as told by your health care provider.  Get screening tests as told by your health care provider. You may have these regularly, especially if you have certain risk factors for type 2 diabetes.  Make an appointment with a registered dietitian. This diet and nutrition specialist can help you make a healthy eating plan and help you understand portion sizes and food labels. Where to find support  Ask your health care provider to recommend a registered dietitian, a certified diabetes care and education specialist, or a weight loss program.  Look for local or online weight loss groups.  Join a gym, fitness club, or outdoor activity group, such as a walking club. Where to find more information To learn more about diabetes and diabetes prevention, visit:  American Diabetes Association (ADA): www.diabetes.org  National Institute of Diabetes and Digestive and Kidney Diseases: www.niddk.nih.gov To learn more about healthy eating, visit:  U.S. Department of Agriculture (USDA): www.choosemyplate.gov  Office of Disease Prevention and Health Promotion (ODPHP): health.gov Summary  You can  delay or prevent type 2 diabetes by eating healthy foods, losing weight if needed, and increasing your physical activity.  Talk with your health care provider about your risk factors for type 2 diabetes and how you can reduce your risk.  It can be difficult to recognize the signs of type 2 diabetes. The best way to avoid possible damage to your body is to take action to prevent the disease before you develop symptoms.  Get screening tests as told by your health care provider. This information is not intended to replace advice given to you by your health care provider. Make sure you discuss any questions you have with your health care provider. Document Revised: 12/24/2019 Document Reviewed: 12/24/2019 Elsevier Patient Education  2021 Elsevier Inc.  

## 2020-07-23 NOTE — Addendum Note (Signed)
Addended by: Susy Manor on: 07/23/2020 03:49 PM   Modules accepted: Orders

## 2020-12-08 ENCOUNTER — Ambulatory Visit (INDEPENDENT_AMBULATORY_CARE_PROVIDER_SITE_OTHER): Payer: BC Managed Care – PPO | Admitting: Podiatry

## 2020-12-08 ENCOUNTER — Ambulatory Visit (INDEPENDENT_AMBULATORY_CARE_PROVIDER_SITE_OTHER): Payer: BC Managed Care – PPO

## 2020-12-08 ENCOUNTER — Other Ambulatory Visit: Payer: Self-pay

## 2020-12-08 DIAGNOSIS — M7752 Other enthesopathy of left foot: Secondary | ICD-10-CM

## 2020-12-08 DIAGNOSIS — M2012 Hallux valgus (acquired), left foot: Secondary | ICD-10-CM | POA: Diagnosis not present

## 2020-12-08 DIAGNOSIS — M21619 Bunion of unspecified foot: Secondary | ICD-10-CM

## 2020-12-08 DIAGNOSIS — M2011 Hallux valgus (acquired), right foot: Secondary | ICD-10-CM

## 2020-12-08 DIAGNOSIS — M21611 Bunion of right foot: Secondary | ICD-10-CM

## 2020-12-08 DIAGNOSIS — M21612 Bunion of left foot: Secondary | ICD-10-CM | POA: Diagnosis not present

## 2020-12-08 MED ORDER — MELOXICAM 15 MG PO TABS: 15.0000 mg | ORAL_TABLET | Freq: Every day | ORAL | 1 refills | Status: DC

## 2020-12-08 MED ORDER — BETAMETHASONE SOD PHOS & ACET 6 (3-3) MG/ML IJ SUSP
3.0000 mg | Freq: Once | INTRAMUSCULAR | Status: AC
  Administered 2020-12-08: 3 mg via INTRA_ARTICULAR

## 2020-12-08 NOTE — Progress Notes (Signed)
   Subjective: 55 y.o. female presents today as a new patient for evaluation of bunion deformities to the bilateral feet.  Patient states that she has had pain especially to the left foot for approximately 1 year now ever since she stubbed her toe against a dresser drawer.  She has had pain and tenderness.  She has been wearing bunion pads and cushions with some moderate relief.  She presents for further treatment and evaluation   Past Medical History:  Diagnosis Date   Abnormal Pap smear of cervix    Anemia    due to fibroid tumors   Fibroid    Hypertension    Leiomyoma    Prediabetes 01/23/2020   Urticaria    fish      Objective: Physical Exam General: The patient is alert and oriented x3 in no acute distress.  Dermatology: Skin is cool, dry and supple bilateral lower extremities. Negative for open lesions or macerations.  Vascular: Palpable pedal pulses bilaterally. No edema or erythema noted. Capillary refill within normal limits.  Neurological: Epicritic and protective threshold grossly intact bilaterally.   Musculoskeletal Exam: Clinical evidence of bunion deformity noted to the respective foot. There is moderate pain on palpation range of motion of the first MPJ. Lateral deviation of the hallux noted consistent with hallux abductovalgus. Pain on palpation range of motion of the first MTPJ left foot  Radiographic Exam: Increased intermetatarsal angle greater than 15 with a hallux abductus angle greater than 30 noted on AP view. Moderate degenerative changes noted within the first MPJ.  Assessment: 1. HAV w/ bunion deformity bilateral 2.  First MTPJ capsulitis left   Plan of Care:  1. Patient was evaluated. X-Rays reviewed. 2.  The patient has not had any pain or issues with the bunion deformities until approximately 1 year ago.  Today we will pursue conservative treatment to see if we can alleviate her symptoms without pursuing surgery. 3.  Injection of 0.5 cc  Celestone Soluspan injection of the first MTPJ left 4.  Prescription for meloxicam 15 mg daily 5.  Continue silicone gel bunion pads as needed 6.  Recommend wide fitting shoes that do not constrict the toebox area 7.  Return to clinic as needed      Edrick Kins, DPM [facility]  Dr. Edrick Kins, [address]                                        [place], [place] [address]                Office [phone]  Fax [phone]

## 2020-12-08 NOTE — Progress Notes (Signed)
gle

## 2021-01-07 ENCOUNTER — Other Ambulatory Visit: Payer: Self-pay

## 2021-01-07 ENCOUNTER — Encounter: Payer: Self-pay | Admitting: Allergy

## 2021-01-07 ENCOUNTER — Ambulatory Visit: Payer: BC Managed Care – PPO | Admitting: Allergy

## 2021-01-07 VITALS — BP 140/92 | HR 68 | Temp 97.8°F | Resp 16 | Ht 60.0 in | Wt 174.4 lb

## 2021-01-07 DIAGNOSIS — T7800XA Anaphylactic reaction due to unspecified food, initial encounter: Secondary | ICD-10-CM

## 2021-01-07 DIAGNOSIS — H1013 Acute atopic conjunctivitis, bilateral: Secondary | ICD-10-CM

## 2021-01-07 DIAGNOSIS — J3089 Other allergic rhinitis: Secondary | ICD-10-CM

## 2021-01-07 DIAGNOSIS — L299 Pruritus, unspecified: Secondary | ICD-10-CM | POA: Diagnosis not present

## 2021-01-07 MED ORDER — CRISABOROLE 2 % EX OINT: 1.0000 "application " | TOPICAL_OINTMENT | Freq: Two times a day (BID) | CUTANEOUS | 3 refills | Status: DC | PRN

## 2021-01-07 NOTE — Progress Notes (Signed)
Follow-up Note  RE: Cindy Zimmerman MRN: [id number] DOB: 08-09-1965 Date of Office Visit: 01/07/2021   History of present illness: Cindy Zimmerman is a 55 y.o. female presenting today for follow-up of allergic rhinitis with conjunctivitis and food allergy.  She was last seen in the office on 01/02/2020 by myself.  She denies any major health changes, surgeries or hospitalizations in the past year.  Her most concerning complaint today is she has been having itching of her neck and it has extended to her dcolletage area.  She has not noted any palpable or visible rash.  This has been intermittent since May 2022 and she feels the itch about 3 times a week mostly at night.  She has used cold compresses.  She has had no change in her diet.  She has used an OTC cream that she states has helped with itch.  She has issuing sensitive skin detergents.  She has not changed her hair products and ties up her hair at night for bedtime.  No change in jewelry and states she wears the same jewelry most times.  She is not taking antihistamine on daily basis.  She does have claritin and allegra at home.  At this time not reporting significant nasal or ocular symptoms.  She continues to avoid fish, shellfish and strawberry without accidental ingestions.    Review of systems: Review of Systems  Constitutional: Negative.   HENT: Negative.    Eyes: Negative.   Respiratory: Negative.    Cardiovascular: Negative.   Gastrointestinal: Negative.   Musculoskeletal: Negative.   Skin:  Positive for itching. Negative for rash.  Neurological: Negative.    All other systems negative unless noted above in HPI  Past medical/social/surgical/family history have been reviewed and are unchanged unless specifically indicated below.  No changes  Medication List: Current Outpatient Medications  Medication Sig Dispense Refill   Crisaborole 2 % OINT Apply 1 application topically 2 (two) times daily as needed (Itch or rash).  60 g 3   EPINEPHrine (AUVI-Q) 0.3 mg/0.3 mL IJ SOAJ injection Inject 0.3 mLs (0.3 mg total) into the muscle as needed for anaphylaxis. 1 each 0   lisinopril (ZESTRIL) 40 MG tablet TAKE 1 TABLET BY MOUTH EVERY DAY 90 tablet 3   Multiple Vitamin (MULTIVITAMIN) tablet Take 1 tablet by mouth daily.     No current facility-administered medications for this visit.     Known medication allergies: Allergies  Allergen Reactions   Fish Allergy Anaphylaxis and Hives   Strawberry (Diagnostic) Anaphylaxis, Swelling and Other (See Comments)    Eyes swell   Tamiflu [Oseltamivir] Hives     Physical examination: Blood pressure (!) 140/92, pulse 68, temperature 97.8 F (36.6 C), temperature source Temporal, resp. rate 16, height 5' (1.524 m), weight 174 lb 6.4 oz (79.1 kg), last menstrual period 07/12/1999, SpO2 96 %.  General: Alert, interactive, in no acute distress. HEENT: PERRLA, TMs pearly gray, turbinates non-edematous without discharge, post-pharynx non erythematous. Neck: Supple without lymphadenopathy. Lungs: Clear to auscultation without wheezing, rhonchi or rales. {no increased work of breathing. CV: Normal S1, S2 without murmurs. Abdomen: Nondistended, nontender. Skin: Warm and dry, without lesions or rashes. Extremities:  No clubbing, cyanosis or edema. Neuro:   Grossly intact.  Diagnositics/Labs: None today  Assessment and plan:   Allergic rhinitis with conjunctivitis   - continue avoidance measures for weed pollen, tree pollen and molds   - use of Allegra '180mg'$ , Zyrtec '10mg'$  or Xyzal '5mg'$  daily    -  for itchy/watery/eyes can use of allergy eyedrops like Pataday which is over-the-counter.      - if needed for congestion/drainage use Flonase 2 sprays each nostril daily for 1-2 weeks at a time before stopping.     Food allergy    - continue avoidance of all fish/shellfish and strawberry    - have access to self-injectable epinephrine  AuviQ 0.'3mg'$  at all times    - follow  emergency action plan in case of allergic reaction  Pruritus    - neck itching    - recommend taking Allegra or Claritin daily at this time around dinnertime    - keep area well moisturized    - can perform patch testing to see if you have contact allergen driving symptoms  Follow- up 6 months or sooner if needed I appreciate the opportunity to take part in Cindy Zimmerman's care. Please do not hesitate to contact me with questions.  Sincerely,   Prudy Feeler, MD Allergy/Immunology [facility]

## 2021-01-07 NOTE — Patient Instructions (Addendum)
Allergic rhinitis with conjunctivitis   - continue avoidance measures for weed pollen, tree pollen and molds   - use of Allegra '180mg'$ , Zyrtec '10mg'$  or Xyzal '5mg'$  daily    - for itchy/watery/eyes can use of allergy eyedrops like Pataday which is over-the-counter.      - if needed for congestion/drainage use Flonase 2 sprays each nostril daily for 1-2 weeks at a time before stopping.     Food allergy    - continue avoidance of all fish/shellfish and strawberry    - have access to self-injectable epinephrine  AuviQ 0.'3mg'$  at all times    - follow emergency action plan in case of allergic reaction  Pruritus    - neck itching    - recommend taking Allegra or Claritin daily at this time around dinnertime    - keep area well moisturized    - can perform patch testing to see if you have contact allergen driving symptoms  Follow- up 6 months or sooner if needed

## 2021-01-13 ENCOUNTER — Other Ambulatory Visit: Payer: Self-pay

## 2021-01-13 ENCOUNTER — Encounter: Payer: Self-pay | Admitting: Obstetrics & Gynecology

## 2021-01-13 ENCOUNTER — Ambulatory Visit (INDEPENDENT_AMBULATORY_CARE_PROVIDER_SITE_OTHER): Payer: BC Managed Care – PPO | Admitting: Obstetrics & Gynecology

## 2021-01-13 ENCOUNTER — Other Ambulatory Visit (HOSPITAL_COMMUNITY)
Admission: RE | Admit: 2021-01-13 | Discharge: 2021-01-13 | Disposition: A | Discharge status: Home / Self Care | Payer: BC Managed Care – PPO | Source: Ambulatory Visit | Attending: Nurse Practitioner | Admitting: Nurse Practitioner

## 2021-01-13 VITALS — BP 138/80 | HR 78 | Ht 61.25 in | Wt 172.0 lb

## 2021-01-13 DIAGNOSIS — Z01419 Encounter for gynecological examination (general) (routine) without abnormal findings: Secondary | ICD-10-CM | POA: Insufficient documentation

## 2021-01-13 DIAGNOSIS — Z1272 Encounter for screening for malignant neoplasm of vagina: Secondary | ICD-10-CM

## 2021-01-13 DIAGNOSIS — E6609 Other obesity due to excess calories: Secondary | ICD-10-CM | POA: Diagnosis not present

## 2021-01-13 DIAGNOSIS — K640 First degree hemorrhoids: Secondary | ICD-10-CM | POA: Diagnosis not present

## 2021-01-13 DIAGNOSIS — Z6832 Body mass index (BMI) 32.0-32.9, adult: Secondary | ICD-10-CM

## 2021-01-13 DIAGNOSIS — Z90710 Acquired absence of both cervix and uterus: Secondary | ICD-10-CM | POA: Diagnosis not present

## 2021-01-13 NOTE — Progress Notes (Signed)
Cindy Zimmerman 1966-03-09 [id number]   History:    55 y.o. G0 Single.  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  RP:  Established patient presenting for Annual Gynecologic exam  HPI:  S/P TAH for Fibroids.  No menopausal Sx.  No pelvic pain.  Abstinent.  Urine/BMs normal.  Complains of feeling a small soft lump at the anus.  Not tender.  No rectal bleeding.  Breasts normal.  Screening mammo Neg 04/2020.  BMI 32.23.  Health labs with Fam MD.  Harriet Masson 2018 Benign Polyps.   Past medical history,surgical history, family history and social history were all reviewed and documented in the EPIC chart.  Gynecologic History Patient's last menstrual period was 07/12/1999 (exact date).  Obstetric History OB History  Gravida Para Term Preterm AB Living  0 0 0 0 0 0  SAB IAB Ectopic Multiple Live Births  0 0 0 0 0     ROS: A ROS was performed and pertinent positives and negatives are included in the history.  GENERAL: No fevers or chills. HEENT: No change in vision, no earache, sore throat or sinus congestion. NECK: No pain or stiffness. CARDIOVASCULAR: No chest pain or pressure. No palpitations. PULMONARY: No shortness of breath, cough or wheeze. GASTROINTESTINAL: No abdominal pain, nausea, vomiting or diarrhea, melena or bright red blood per rectum. GENITOURINARY: No urinary frequency, urgency, hesitancy or dysuria. MUSCULOSKELETAL: No joint or muscle pain, no back pain, no recent trauma. DERMATOLOGIC: No rash, no itching, no lesions. ENDOCRINE: No polyuria, polydipsia, no heat or cold intolerance. No recent change in weight. HEMATOLOGICAL: No anemia or easy bruising or bleeding. NEUROLOGIC: No headache, seizures, numbness, tingling or weakness. PSYCHIATRIC: No depression, no loss of interest in normal activity or change in sleep pattern.     Exam:   BP 138/80 (BP Location: Right Arm, Patient Position: Sitting)   Pulse 78   Ht 5' 1.25" (1.556 m)   Wt 172 lb (78 kg)   LMP 07/12/1999 (Exact Date)    SpO2 99%   BMI 32.23 kg/m   Body mass index is 32.23 kg/m.  General appearance : Well developed well nourished female. No acute distress HEENT: Eyes: no retinal hemorrhage or exudates,  Neck supple, trachea midline, no carotid bruits, no thyroidmegaly Lungs: Clear to auscultation, no rhonchi or wheezes, or rib retractions  Heart: Regular rate and rhythm, no murmurs or gallops Breast:Examined in sitting and supine position were symmetrical in appearance, no palpable masses or tenderness,  no skin retraction, no nipple inversion, no nipple discharge, no skin discoloration, no axillary or supraclavicular lymphadenopathy Abdomen: no palpable masses or tenderness, no rebound or guarding Extremities: no edema or skin discoloration or tenderness  Pelvic: Vulva: Normal             Vagina: No gross lesions or discharge.  Pap reflex done.  Cervix/Uterus absent  Adnexa  Without masses or tenderness  Anus: Normal except for small soft hemorrhoid, not inflamed, not bleeding.   Assessment/Plan:  55 y.o. female for annual exam   1. Encounter for Papanicolaou smear of vagina as part of routine gynecological examination Gynecologic exam status post TAH.  Pap reflex done on the vaginal vault.  Breast exam normal.  Screening mammogram November 2021 was negative.  Benign polyps on colonoscopy in 2018, recommend repeating a colonoscopy at 5 years in 2023.  Health labs with family physician. - Cytology - PAP( [facility])  2. S/P TAH (total abdominal hysterectomy)  3. Grade I hemorrhoids Small as symptomatic  hemorrhoid.  Will observe.  4. Class 1 obesity due to excess calories with serious comorbidity and body mass index (BMI) of 32.0 to 32.9 in adult  Recommend a lower calorie/carb diet.  Aerobic activities 5 times a week and light weightlifting every 2 days.  Princess Bruins MD, 8:24 AM 01/13/2021

## 2021-01-14 ENCOUNTER — Telehealth: Payer: Self-pay

## 2021-01-14 NOTE — Telephone Encounter (Signed)
Pa for eucrisa was denied as pt does not have mild or moderate atopic dermatitis please advise to change

## 2021-01-14 NOTE — Telephone Encounter (Signed)
Pa submitted thru cover my meds for eucrisa waiting on results

## 2021-01-18 LAB — CYTOLOGY - PAP: Diagnosis: NEGATIVE

## 2021-01-18 NOTE — Telephone Encounter (Signed)
Pt informed of this and stated understanding  

## 2021-01-24 ENCOUNTER — Ambulatory Visit: Payer: BC Managed Care – PPO | Admitting: Nurse Practitioner

## 2021-01-27 ENCOUNTER — Ambulatory Visit: Payer: BC Managed Care – PPO | Admitting: Obstetrics and Gynecology

## 2021-01-31 ENCOUNTER — Ambulatory Visit: Payer: BC Managed Care – PPO | Admitting: Nurse Practitioner

## 2021-02-05 ENCOUNTER — Other Ambulatory Visit: Payer: Self-pay | Admitting: Podiatry

## 2021-04-28 ENCOUNTER — Other Ambulatory Visit: Payer: Self-pay | Admitting: Podiatry

## 2021-04-28 NOTE — Telephone Encounter (Signed)
Please advise 

## 2021-05-24 NOTE — Telephone Encounter (Signed)
Left a message for patient to call the office regarding samples of Eucrisa that have been placed up front for pick up. Will need to see if patient is still in need of it.

## 2021-05-30 NOTE — Telephone Encounter (Signed)
Called and left a voicemail asking for patient to return call to discuss.  °

## 2021-06-07 ENCOUNTER — Other Ambulatory Visit: Payer: Self-pay | Admitting: Family Medicine

## 2021-06-07 DIAGNOSIS — I10 Essential (primary) hypertension: Secondary | ICD-10-CM

## 2021-06-07 NOTE — Telephone Encounter (Signed)
Called and left a voicemail asking for patient to return call to discuss.  °

## 2021-06-07 NOTE — Telephone Encounter (Signed)
Patient called back and stated she picked up samples last  week.

## 2021-07-14 ENCOUNTER — Encounter: Payer: Self-pay | Admitting: Allergy

## 2021-07-14 ENCOUNTER — Ambulatory Visit: Payer: BC Managed Care – PPO | Admitting: Allergy

## 2021-07-14 ENCOUNTER — Other Ambulatory Visit: Payer: Self-pay

## 2021-07-14 VITALS — BP 160/94 | HR 82 | Temp 97.8°F | Resp 20 | Ht 60.0 in | Wt 169.6 lb

## 2021-07-14 DIAGNOSIS — H1013 Acute atopic conjunctivitis, bilateral: Secondary | ICD-10-CM

## 2021-07-14 DIAGNOSIS — L299 Pruritus, unspecified: Secondary | ICD-10-CM | POA: Diagnosis not present

## 2021-07-14 DIAGNOSIS — T7800XA Anaphylactic reaction due to unspecified food, initial encounter: Secondary | ICD-10-CM

## 2021-07-14 DIAGNOSIS — J3089 Other allergic rhinitis: Secondary | ICD-10-CM | POA: Diagnosis not present

## 2021-07-14 MED ORDER — EPINEPHRINE 0.3 MG/0.3ML IJ SOAJ: 0.3000 mg | INTRAMUSCULAR | 1 refills | Status: DC | PRN

## 2021-07-14 NOTE — Patient Instructions (Addendum)
Allergic rhinitis with conjunctivitis   - continue avoidance measures for weed pollen, tree pollen and molds   - use of Allegra 180mg , Zyrtec 10mg  or Xyzal 5mg  daily    - for itchy/watery/eyes can use of allergy eyedrops like Pataday which is over-the-counter.      - for nasal drainage with cough try use of nasal spray Ryaltris 2 sprays each nostril 1-2 times a day (definitely use at bedtime).  If effective let us know and we can send in a prescription.         - hold Flonase while using Ryaltris  Food allergy    - continue avoidance of all fish/shellfish and strawberry    - have access to self-injectable epinephrine  AuviQ 0.3mg  at all times    - follow emergency action plan in case of allergic reaction  Pruritus    - improved    - continue as needed use of Eucrisa to itchy areas  Follow- up 6-12 months or sooner if needed

## 2021-07-14 NOTE — Progress Notes (Signed)
Follow-up Note  RE: Cindy Zimmerman MRN: [id number] DOB: [birth year] Date of Office Visit: 07/14/2021   History of present illness: Cindy Zimmerman is a 56 y.o. female presenting today for follow-up of allergic rhinitis with conjunctivitis, food allergy and pruritus.  She was last seen in the office on 01/07/2021 by myself.  She has done well since this visit without any major health changes, surgeries or hospitalizations. She states the itching she was having on her neck has resolved.  She states the [place] that I provided her last time really helps.  She uses a small amount when she is having the itching resolves it. She states she is having a cough at night which she feels is related to nasal drainage.  She is on lisinopril and says she looked up the side effects and cough is one of them (she has correct).  She has discussed with her PCP in regards to changing his medication but she states at that time she had just gotten her lisinopril filled. She is currently not taking any antihistamines.  She does have access to Flonase nasal spray for congestion. She continues to avoid all fish and shellfish as well as strawberry.  She has access to an epinephrine device that she has not needed to use.  Review of systems: Review of Systems  Constitutional: Negative.   HENT:  Positive for postnasal drip.   Eyes: Negative.   Respiratory:  Positive for cough.   Cardiovascular: Negative.   Gastrointestinal: Negative.   Musculoskeletal: Negative.   Skin: Negative.   Allergic/Immunologic: Negative.   Neurological: Negative.     All other systems negative unless noted above in HPI  Past medical/social/surgical/family history have been reviewed and are unchanged unless specifically indicated below.  No changes  Medication List: Current Outpatient Medications  Medication Sig Dispense Refill   Crisaborole 2 % OINT Apply 1 application topically 2 (two) times daily as needed (Itch or rash). 60 g 3    lisinopril (ZESTRIL) 40 MG tablet TAKE 1 TABLET BY MOUTH EVERY DAY 90 tablet 1   meloxicam (MOBIC) 15 MG tablet TAKE 1 TABLET (15 MG TOTAL) BY MOUTH DAILY. 30 tablet 1   Multiple Vitamin (MULTIVITAMIN) tablet Take 1 tablet by mouth daily.     EPINEPHrine (AUVI-Q) 0.3 mg/0.3 mL IJ SOAJ injection Inject 0.3 mg into the muscle as needed for anaphylaxis. 1 each 1   No current facility-administered medications for this visit.     Known medication allergies: Allergies  Allergen Reactions   Fish Allergy Anaphylaxis and Hives   Strawberry (Diagnostic) Anaphylaxis, Swelling and Other (See Comments)    Eyes swell   Tamiflu [Oseltamivir] Hives     Physical examination: Blood pressure (!) 160/94, pulse 82, temperature 97.8 F (36.6 C), temperature source Temporal, resp. rate 20, height 5' (1.524 m), weight 169 lb 9.6 oz (76.9 kg), last menstrual period 07/12/1999, SpO2 98 %.  General: Alert, interactive, in no acute distress. HEENT: PERRLA, TMs pearly gray, turbinates non-edematous without discharge, post-pharynx non erythematous. Neck: Supple without lymphadenopathy. Lungs: Clear to auscultation without wheezing, rhonchi or rales. {no increased work of breathing. CV: Normal S1, S2 without murmurs. Abdomen: Nondistended, nontender. Skin: Warm and dry, without lesions or rashes. Extremities:  No clubbing, cyanosis or edema. Neuro:   Grossly intact.  Diagnositics/Labs: None today  Assessment and plan:   Allergic rhinitis with conjunctivitis   - continue avoidance measures for weed pollen, tree pollen and molds   - use of Allegra  180mg , Zyrtec 10mg  or Xyzal 5mg  daily    - for itchy/watery/eyes can use of allergy eyedrops like Pataday which is over-the-counter.      - for nasal drainage with cough try use of nasal spray Ryaltris 2 sprays each nostril 1-2 times a day (definitely use at bedtime).  If effective let us know and we can send in a prescription.         - hold Flonase while  using Ryaltris    - advised if cough does not improve will reeval shortness and then would recommend changing her lisinopril to see if it is because of the cough  Food allergy    - continue avoidance of all fish/shellfish and strawberry    - have access to self-injectable epinephrine  AuviQ 0.3mg  at all times    - follow emergency action plan in case of allergic reaction  Pruritus    - improved    - continue as needed use of Eucrisa to itchy areas  Follow- up 6-12 months or sooner if needed   I appreciate the opportunity to take part in Cindy Zimmerman's care. Please do not hesitate to contact me with questions.  Sincerely,   Prudy Feeler, MD Allergy/Immunology [facility]

## 2021-07-18 ENCOUNTER — Other Ambulatory Visit: Payer: Self-pay | Admitting: Family Medicine

## 2021-07-18 DIAGNOSIS — Z1231 Encounter for screening mammogram for malignant neoplasm of breast: Secondary | ICD-10-CM

## 2021-07-20 ENCOUNTER — Ambulatory Visit: Payer: BC Managed Care – PPO

## 2021-07-28 ENCOUNTER — Telehealth: Payer: Self-pay | Admitting: Family Medicine

## 2021-07-28 NOTE — Telephone Encounter (Signed)
Patient calling in with respiratory symptoms: Shortness of breath, chest pain, palpitations or other red words send to Triage  Does the patient have a fever over 100, cough, congestion, sore throat, runny nose, lost of taste/smell (please list symptoms that patient has)? little cough, runny nose, sinus congestion/swollen sinuses  What date did symptoms start? X 5 days ago  (If over 5 days ago, pt may be scheduled for in person visit)  Have you tested for Covid in the last 5 days? Yes   If yes, was it positive []  OR negative [x] ? If positive in the last 5 days, please schedule virtual visit now. If negative, schedule for an in person OV with the next available provider if PCP has no openings. Please also let patient know they will be tested again (follow the script below)  "you will have to arrive 5mins prior to your appt time to be Covid tested. Please park in back of office at the [facility] & call [phone] to let the staff know you have arrived. A staff member will meet you at your car to do a rapid covid test. Once the test has resulted you will be notified by phone of your results to determine if appt will remain an in person visit or be converted to a virtual/phone visit. If you arrive less than 76mins before your appt time, your visit will be automatically converted to virtual & any recommended testing will happen AFTER the visit."   [facility]  If no availability for virtual visit in office,  please schedule another [place] office  If no availability at another [place] office, please instruct patient that they can schedule an evisit or virtual visit through their mychart account. Visits up to 8pm  patients can be seen in office 5 days after positive COVID test

## 2021-07-29 ENCOUNTER — Ambulatory Visit: Payer: BC Managed Care – PPO | Admitting: Family Medicine

## 2021-08-01 ENCOUNTER — Ambulatory Visit
Admission: RE | Admit: 2021-08-01 | Discharge: 2021-08-01 | Disposition: A | Discharge status: Home / Self Care | Payer: BC Managed Care – PPO | Source: Ambulatory Visit | Attending: Family Medicine | Admitting: Family Medicine

## 2021-08-01 DIAGNOSIS — Z1231 Encounter for screening mammogram for malignant neoplasm of breast: Secondary | ICD-10-CM

## 2021-12-19 ENCOUNTER — Other Ambulatory Visit: Payer: Self-pay | Admitting: Family Medicine

## 2021-12-19 DIAGNOSIS — I10 Essential (primary) hypertension: Secondary | ICD-10-CM

## 2021-12-21 ENCOUNTER — Ambulatory Visit: Payer: BC Managed Care – PPO | Admitting: Family Medicine

## 2021-12-21 ENCOUNTER — Encounter: Payer: Self-pay | Admitting: Family Medicine

## 2021-12-21 VITALS — BP 146/100 | HR 76 | Temp 98.0°F | Wt 173.0 lb

## 2021-12-21 DIAGNOSIS — I10 Essential (primary) hypertension: Secondary | ICD-10-CM

## 2021-12-21 DIAGNOSIS — Z91199 Patient's noncompliance with other medical treatment and regimen due to unspecified reason: Secondary | ICD-10-CM

## 2021-12-21 DIAGNOSIS — R7303 Prediabetes: Secondary | ICD-10-CM

## 2021-12-21 LAB — BASIC METABOLIC PANEL
BUN: 7 mg/dL (ref 6–23)
CO2: 29 mEq/L (ref 19–32)
Calcium: 10 mg/dL (ref 8.4–10.5)
Chloride: 103 mEq/L (ref 96–112)
Creatinine, Ser: 0.78 mg/dL (ref 0.40–1.20)
GFR: 84.58 mL/min (ref >60.00)
Glucose, Bld: 86 mg/dL (ref 70–99)
Potassium: 3.8 mEq/L (ref 3.5–5.1)
Sodium: 138 mEq/L (ref 135–145)

## 2021-12-21 LAB — TSH: TSH: 2.02 u[IU]/mL (ref 0.35–5.50)

## 2021-12-21 LAB — T4, FREE: Free T4: 0.97 ng/dL (ref 0.60–1.60)

## 2021-12-21 LAB — HEMOGLOBIN A1C: Hgb A1c MFr Bld: 6.3 % (ref 4.6–6.5)

## 2021-12-21 MED ORDER — LISINOPRIL 40 MG PO TABS: 40.0000 mg | ORAL_TABLET | Freq: Every day | ORAL | 1 refills | Status: DC

## 2021-12-21 NOTE — Patient Instructions (Addendum)
Pt checking your blood pressure regularly and keeping a log to bring with you to clinic.  Is important that you start taking your blood pressure medication every day.  You also need to reduce her sodium intake to help better control your blood pressure.    We will have you follow-up in the next 2 weeks to see how your blood pressure is doing.  For continued elevation additional medication will likely be needed.  If you have snoring at night it would be beneficial to have a sleep study done.

## 2021-12-21 NOTE — Progress Notes (Signed)
Subjective:    Patient ID: Cindy Zimmerman, female    DOB: 10/04/1965, 56 y.o.   MRN: [id number]  Chief Complaint  Patient presents with   Hypertension    HPI Patient lost to follow-up who was seen today for f/u on HTN.  Pt not taking lisinopril 40 mg every day.  Denies HAs, CP, LE edema.  Working on drinking more water.  Not check bp at home.  Pt states she took med this am right before appt.  Endorses eating salty foods recently and drinking caffeine which likely elevated bp.  Pt advised bp was uncontrolled at allergist appt several months ago.  Past Medical History:  Diagnosis Date   Abnormal Pap smear of cervix    Anemia    due to fibroid tumors   Fibroid    Hypertension    Leiomyoma    Prediabetes 01/23/2020   Urticaria    fish    Allergies  Allergen Reactions   Fish Allergy Anaphylaxis and Hives   Strawberry (Diagnostic) Anaphylaxis, Swelling and Other (See Comments)    Eyes swell   Tamiflu [Oseltamivir] Hives    ROS General: Denies fever, chills, night sweats, changes in weight, changes in appetite HEENT: Denies headaches, ear pain, changes in vision, rhinorrhea, sore throat CV: Denies CP, palpitations, SOB, orthopnea Pulm: Denies SOB, cough, wheezing GI: Denies abdominal pain, nausea, vomiting, diarrhea, constipation GU: Denies dysuria, hematuria, frequency, vaginal discharge Msk: Denies muscle cramps, joint pains Neuro: Denies weakness, numbness, tingling Skin: Denies rashes, bruising Psych: Denies depression, anxiety, hallucinations     Objective:    Blood pressure (!) 146/100, pulse 76, temperature 98 F (36.7 C), temperature source Oral, weight 173 lb (78.5 kg), last menstrual period 07/12/1999, SpO2 98 %.  Gen. Pleasant, well-nourished, in no distress, normal affect   HEENT: [place]/AT, face symmetric, conjunctiva clear, no scleral icterus, PERRLA, EOMI, nares patent without drainage Lungs: no accessory muscle use, CTAB, no wheezes or rales Cardiovascular:  RRR, no m/r/g, no peripheral edema Musculoskeletal: No deformities, no cyanosis or clubbing, normal tone Neuro:  A&Ox3, CN II-XII intact, normal gait Skin:  Warm, no lesions/ rash   Wt Readings from Last 3 Encounters:  12/21/21 173 lb (78.5 kg)  07/14/21 169 lb 9.6 oz (76.9 kg)  01/13/21 172 lb (78 kg)    Lab Results  Component Value Date   WBC 4.2 01/21/2020   HGB 12.9 01/21/2020   HCT 39.1 01/21/2020   PLT 221 01/21/2020   GLUCOSE 76 01/21/2020   CHOL 205 (H) 01/21/2020   TRIG 168 (H) 01/21/2020   HDL 59 01/21/2020   LDLCALC 117 (H) 01/21/2020   ALT 15 01/21/2020   AST 20 01/21/2020   NA 141 01/21/2020   K 3.7 01/21/2020   CL 105 01/21/2020   CREATININE 0.74 01/21/2020   BUN 8 01/21/2020   CO2 26 01/21/2020   TSH 1.010 01/21/2020   HGBA1C 5.9 (H) 01/21/2020    Assessment/Plan:  Essential hypertension -Elevated 2/2 noncompliance -Patient advised on the importance of taking medication consistently every day -Vies to decrease sodium intake and increase p.o. intake of water. -Continue lisinopril 40 mg daily -Patient encouraged to check BP at home and keep a log to bring with her to clinic. -Advised if BP consistently greater than 140/90 additional medication needed. -Given handouts - Plan: Basic metabolic panel, TSH, T4, Free, lisinopril (ZESTRIL) 40 MG tablet  Noncompliance -Discussed the importance of taking medications consistently  Prediabetes  -Hemoglobin A1c 5.9% on 01/21/2020 -lifestyle modifications -  Plan: Hemoglobin A1c  F/u in 2 wks  Grier Mitts, MD

## 2022-01-04 ENCOUNTER — Ambulatory Visit: Payer: BC Managed Care – PPO | Admitting: Family Medicine

## 2022-01-04 VITALS — BP 146/98 | HR 70 | Temp 98.0°F | Wt 172.2 lb

## 2022-01-04 DIAGNOSIS — I10 Essential (primary) hypertension: Secondary | ICD-10-CM

## 2022-01-04 MED ORDER — HYDROCHLOROTHIAZIDE 12.5 MG PO TABS: 12.5000 mg | ORAL_TABLET | Freq: Every day | ORAL | 3 refills | Status: DC

## 2022-01-04 NOTE — Patient Instructions (Signed)
A prescription for hydrochlorothiazide 12.5 mg was sent to your pharmacy.  This is another prescription for blood pressure.  You are to take this with the lisinopril daily.  Continue drinking plenty of water daily and checking your blood pressure.

## 2022-01-04 NOTE — Progress Notes (Signed)
Subjective:    Patient ID: Cindy Zimmerman, female    DOB: 08-09-1965, 56 y.o.   MRN: [id number]  Chief Complaint  Patient presents with   Follow-up   Hypertension    HPI Patient was seen today for f/u on HTN.  BP at last OFV 150/110.  Since last visit patient taking lisinopril 40 mg daily consistently.  Patient cut out sodas, decreased sugar and sodium intake.  Patient walking daily for exercise.  BP at home 141/92, 146/91, 143/83, 139/88, 131/84, 130/78, 132/96, 131/82, 140/83.  Patient notes a decline in weight because and states she feels better overall.   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  Patient mentions the next day after last OFV one of her friends had a CVA.  Past Medical History:  Diagnosis Date   Abnormal Pap smear of cervix    Anemia    due to fibroid tumors   Fibroid    Hypertension    Leiomyoma    Prediabetes 01/23/2020   Urticaria    fish    Allergies  Allergen Reactions   Fish Allergy Anaphylaxis and Hives   Strawberry (Diagnostic) Anaphylaxis, Swelling and Other (See Comments)    Eyes swell   Tamiflu [Oseltamivir] Hives    ROS General: Denies fever, chills, night sweats, changes in weight, changes in appetite HEENT: Denies headaches, ear pain, changes in vision, rhinorrhea, sore throat CV: Denies CP, palpitations, SOB, orthopnea Pulm: Denies SOB, cough, wheezing GI: Denies abdominal pain, nausea, vomiting, diarrhea, constipation GU: Denies dysuria, hematuria, frequency, vaginal discharge Msk: Denies muscle cramps, joint pains Neuro: Denies weakness, numbness, tingling Skin: Denies rashes, bruising Psych: Denies depression, anxiety, hallucinations     Objective:    Blood pressure (!) 146/98, pulse 70, temperature 98 F (36.7 C), temperature source Oral, weight 172 lb 3.2 oz (78.1 kg), last menstrual period 07/12/1999, SpO2 95 %.  BP recheck 158/82, right arm, sitting  Gen. Pleasant, well-nourished, in no distress, normal affect   HEENT: [place]/AT,  face symmetric, conjunctiva clear, no scleral icterus, PERRLA, EOMI, nares patent without drainage Lungs: no accessory muscle use, CTAB, no wheezes or rales Cardiovascular: RRR, no m/r/g, no peripheral edema Neuro:  A&Ox3, CN II-XII intact, normal gait Skin:  Warm, no lesions/ rash   Wt Readings from Last 3 Encounters:  01/04/22 172 lb 3.2 oz (78.1 kg)  12/21/21 173 lb (78.5 kg)  07/14/21 169 lb 9.6 oz (76.9 kg)    Lab Results  Component Value Date   WBC 4.2 01/21/2020   HGB 12.9 01/21/2020   HCT 39.1 01/21/2020   PLT 221 01/21/2020   GLUCOSE 86 12/21/2021   CHOL 205 (H) 01/21/2020   TRIG 168 (H) 01/21/2020   HDL 59 01/21/2020   LDLCALC 117 (H) 01/21/2020   ALT 15 01/21/2020   AST 20 01/21/2020   NA 138 12/21/2021   K 3.8 12/21/2021   CL 103 12/21/2021   CREATININE 0.78 12/21/2021   BUN 7 12/21/2021   CO2 29 12/21/2021   TSH 2.02 12/21/2021   HGBA1C 6.3 12/21/2021    Assessment/Plan:  Essential hypertension  -Uncontrolled -Continue lisinopril 40 mg daily -We will start hydrochlorothiazide 12.5 mg daily -Continue lifestyle modifications -Patient encouraged to continue monitoring BP at home and keeping a log to bring with her to clinic. - Plan: hydrochlorothiazide (HYDRODIURIL) 12.5 MG tablet  F/u 1 month, sooner if needed  Grier Mitts, MD

## 2022-01-17 ENCOUNTER — Encounter: Payer: Self-pay | Admitting: Obstetrics & Gynecology

## 2022-01-17 ENCOUNTER — Ambulatory Visit (INDEPENDENT_AMBULATORY_CARE_PROVIDER_SITE_OTHER): Payer: BC Managed Care – PPO | Admitting: Obstetrics & Gynecology

## 2022-01-17 VITALS — BP 110/80 | HR 88 | Ht 59.25 in | Wt 167.0 lb

## 2022-01-17 DIAGNOSIS — Z6833 Body mass index (BMI) 33.0-33.9, adult: Secondary | ICD-10-CM | POA: Diagnosis not present

## 2022-01-17 DIAGNOSIS — Z90710 Acquired absence of both cervix and uterus: Secondary | ICD-10-CM

## 2022-01-17 DIAGNOSIS — Z780 Asymptomatic menopausal state: Secondary | ICD-10-CM

## 2022-01-17 DIAGNOSIS — Z01419 Encounter for gynecological examination (general) (routine) without abnormal findings: Secondary | ICD-10-CM | POA: Diagnosis not present

## 2022-01-17 NOTE — Progress Notes (Signed)
OSHA RANE 22-Jun-1965 [id number]   History:    56 y.o. G0 Single.  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   RP:  Established patient presenting for Annual Gynecologic exam   HPI:  S/P TAH for Fibroids.  No menopausal Sx.  No pelvic pain.  Abstinent. Pap 01/2021 Neg.  No indication to repeat Pap at this time. Urine/BMs normal.  Colono 02/2017 Benign polyps, will schedule repeat Colono this year.  Breasts normal.  Screening mammo Neg 07/2021.  BMI 33.45.  Loosing weight with low calorie/carb diet.  Walking daily.  Low salt diet.  Normal BP on Meds today.  Health labs with Fam MD.     Past medical history,surgical history, family history and social history were all reviewed and documented in the EPIC chart.  Gynecologic History Patient's last menstrual period was 07/12/1999 (exact date).  Obstetric History OB History  Gravida Para Term Preterm AB Living  0 0 0 0 0 0  SAB IAB Ectopic Multiple Live Births  0 0 0 0 0    ROS: A ROS was performed and pertinent positives and negatives are included in the history. GENERAL: No fevers or chills. HEENT: No change in vision, no earache, sore throat or sinus congestion. NECK: No pain or stiffness. CARDIOVASCULAR: No chest pain or pressure. No palpitations. PULMONARY: No shortness of breath, cough or wheeze. GASTROINTESTINAL: No abdominal pain, nausea, vomiting or diarrhea, melena or bright red blood per rectum. GENITOURINARY: No urinary frequency, urgency, hesitancy or dysuria. MUSCULOSKELETAL: No joint or muscle pain, no back pain, no recent trauma. DERMATOLOGIC: No rash, no itching, no lesions. ENDOCRINE: No polyuria, polydipsia, no heat or cold intolerance. No recent change in weight. HEMATOLOGICAL: No anemia or easy bruising or bleeding. NEUROLOGIC: No headache, seizures, numbness, tingling or weakness. PSYCHIATRIC: No depression, no loss of interest in normal activity or change in sleep pattern.     Exam:   BP 110/80   Pulse 88   Ht 4' 11.25"  (1.505 m)   Wt 167 lb (75.8 kg)   LMP 07/12/1999 (Exact Date)   SpO2 98%   BMI 33.45 kg/m   Body mass index is 33.45 kg/m.  General appearance : Well developed well nourished female. No acute distress HEENT: Eyes: no retinal hemorrhage or exudates,  Neck supple, trachea midline, no carotid bruits, no thyroidmegaly Lungs: Clear to auscultation, no rhonchi or wheezes, or rib retractions  Heart: Regular rate and rhythm, no murmurs or gallops Breast:Examined in sitting and supine position were symmetrical in appearance, no palpable masses or tenderness,  no skin retraction, no nipple inversion, no nipple discharge, no skin discoloration, no axillary or supraclavicular lymphadenopathy Abdomen: no palpable masses or tenderness, no rebound or guarding Extremities: no edema or skin discoloration or tenderness  Pelvic: Vulva: Normal             Vagina: No gross lesions or discharge  Cervix/Uterus absent  Adnexa  Without masses or tenderness  Anus: Normal   Assessment/Plan:  56 y.o. female for annual exam   1. Well female exam with routine gynecological exam S/P TAH for Fibroids.  No menopausal Sx.  No pelvic pain.  Abstinent. Pap 01/2021 Neg.  No indication to repeat Pap at this time. Urine/BMs normal.  Colono 02/2017 Benign polyps, will schedule repeat Colono this year.  Breasts normal.  Screening mammo Neg 07/2021.  BMI 33.45.  Loosing weight with low calorie/carb diet.  Walking daily.  Low salt diet.  Normal BP on Meds today.  Health labs with Fam MD.   2. S/P TAH (total abdominal hysterectomy)  3. Postmenopause S/P TAH for Fibroids.  No menopausal Sx.  No pelvic pain.  Abstinent.  4. BMI 33.0-33.9,adult  Loosing weight with low calorie/carb diet.  Walking daily.  Princess Bruins MD, 4:14 PM 01/17/2022

## 2022-01-27 ENCOUNTER — Ambulatory Visit: Payer: BC Managed Care – PPO | Admitting: Family Medicine

## 2022-01-27 ENCOUNTER — Encounter: Payer: Self-pay | Admitting: Family Medicine

## 2022-01-27 VITALS — BP 130/86 | HR 72 | Temp 98.5°F | Wt 169.2 lb

## 2022-01-27 DIAGNOSIS — I10 Essential (primary) hypertension: Secondary | ICD-10-CM

## 2022-01-27 NOTE — Patient Instructions (Signed)
Keep up the good work

## 2022-01-27 NOTE — Progress Notes (Signed)
Subjective:    Patient ID: Cindy Zimmerman, female    DOB: Nov 16, 1965, 56 y.o.   MRN: [id number]  Chief Complaint  Patient presents with   Follow-up    BP    HPI Patient was seen today for follow-up on HTN.  Patient notes improvement in blood pressure.  Currently taking hydrochlorothiazide 12.5 mg daily and lisinopril 40 mg daily.  BP at OB/GYN office 110/80.  Pt has been intentional about lifestyle modifications.  She is cut out bread from her diet, decreased salt intake, increased water intake, and is walking more.  Pt endorses weight loss.  Pt is determined to get off BP medication.   Past Medical History:  Diagnosis Date   Abnormal Pap smear of cervix    Anemia    due to fibroid tumors   Fibroid    Hypertension    Leiomyoma    Prediabetes 01/23/2020   Urticaria    fish    Allergies  Allergen Reactions   Fish Allergy Anaphylaxis and Hives   Strawberry (Diagnostic) Anaphylaxis, Swelling and Other (See Comments)    Eyes swell   Tamiflu [Oseltamivir] Hives    ROS General: Denies fever, chills, night sweats, changes in weight, changes in appetite + changes in weight HEENT: Denies headaches, ear pain, changes in vision, rhinorrhea, sore throat CV: Denies CP, palpitations, SOB, orthopnea Pulm: Denies SOB, cough, wheezing GI: Denies abdominal pain, nausea, vomiting, diarrhea, constipation GU: Denies dysuria, hematuria, frequency, vaginal discharge Msk: Denies muscle cramps, joint pains Neuro: Denies weakness, numbness, tingling Skin: Denies rashes, bruising Psych: Denies depression, anxiety, hallucinations    Objective:    Blood pressure 130/86, pulse 72, temperature 98.5 F (36.9 C), temperature source Oral, weight 169 lb 3.2 oz (76.7 kg), last menstrual period 07/12/1999, SpO2 97 %.  Gen. Pleasant, well-nourished, in no distress, normal affect   HEENT: [place]/AT, face symmetric, conjunctiva clear, no scleral icterus, PERRLA, EOMI, nares patent without drainage  Lungs: no  accessory muscle use,Cardiovascular: RRR, no peripheral edema Musculoskeletal: No deformities, no cyanosis or clubbing, normal tone Neuro:  A&Ox3, CN II-XII intact, normal gait Skin:  Warm, no lesions/ rash   Wt Readings from Last 3 Encounters:  01/27/22 169 lb 3.2 oz (76.7 kg)  01/17/22 167 lb (75.8 kg)  01/04/22 172 lb 3.2 oz (78.1 kg)    Lab Results  Component Value Date   WBC 4.2 01/21/2020   HGB 12.9 01/21/2020   HCT 39.1 01/21/2020   PLT 221 01/21/2020   GLUCOSE 86 12/21/2021   CHOL 205 (H) 01/21/2020   TRIG 168 (H) 01/21/2020   HDL 59 01/21/2020   LDLCALC 117 (H) 01/21/2020   ALT 15 01/21/2020   AST 20 01/21/2020   NA 138 12/21/2021   K 3.8 12/21/2021   CL 103 12/21/2021   CREATININE 0.78 12/21/2021   BUN 7 12/21/2021   CO2 29 12/21/2021   TSH 2.02 12/21/2021   HGBA1C 6.3 12/21/2021    Assessment/Plan:  Essential hypertension -Controlled -Continue lifestyle modifications -Continue hydrochlorothiazide 12.5 mg and lisinopril 40 mg daily. -Continue monitoring BP at home and keep a log to bring with you to clinic  F/u 4-6 months, sooner if needed  Grier Mitts, MD

## 2022-01-30 ENCOUNTER — Ambulatory Visit: Payer: BC Managed Care – PPO | Admitting: Family Medicine

## 2022-06-07 ENCOUNTER — Ambulatory Visit: Payer: BC Managed Care – PPO | Admitting: Family Medicine

## 2022-06-13 ENCOUNTER — Telehealth: Payer: Self-pay

## 2022-06-13 NOTE — Telephone Encounter (Signed)
Patient called because Dr. Marguerita Merles recommend she schedule her follow up colonoscopy. She thought we would call and schedule it but she has not heard. I advised her that since she is established patient there she can call and arrange the appointment. We do handle new patient referrals.  I provided her the phone number as well and she agreed she will call and arrange her appt.

## 2022-06-18 ENCOUNTER — Other Ambulatory Visit: Payer: Self-pay | Admitting: Family Medicine

## 2022-06-18 DIAGNOSIS — I10 Essential (primary) hypertension: Secondary | ICD-10-CM

## 2022-08-03 ENCOUNTER — Ambulatory Visit: Payer: BC Managed Care – PPO | Admitting: Allergy

## 2022-08-31 ENCOUNTER — Ambulatory Visit: Payer: BC Managed Care – PPO | Admitting: Allergy

## 2022-08-31 ENCOUNTER — Other Ambulatory Visit: Payer: Self-pay

## 2022-08-31 ENCOUNTER — Encounter: Payer: Self-pay | Admitting: Allergy

## 2022-08-31 VITALS — BP 126/92 | HR 83 | Temp 98.1°F | Resp 18 | Ht 59.5 in | Wt 167.8 lb

## 2022-08-31 DIAGNOSIS — J3089 Other allergic rhinitis: Secondary | ICD-10-CM | POA: Diagnosis not present

## 2022-08-31 DIAGNOSIS — H1013 Acute atopic conjunctivitis, bilateral: Secondary | ICD-10-CM | POA: Diagnosis not present

## 2022-08-31 DIAGNOSIS — T7800XD Anaphylactic reaction due to unspecified food, subsequent encounter: Secondary | ICD-10-CM | POA: Diagnosis not present

## 2022-08-31 DIAGNOSIS — L299 Pruritus, unspecified: Secondary | ICD-10-CM | POA: Diagnosis not present

## 2022-08-31 MED ORDER — OLOPATADINE HCL 0.2 % OP SOLN: 1.0000 [drp] | Freq: Every day | OPHTHALMIC | 5 refills | Status: DC | PRN

## 2022-08-31 MED ORDER — AUVI-Q 0.3 MG/0.3ML IJ SOAJ: 0.3000 mg | INTRAMUSCULAR | 1 refills | Status: AC | PRN

## 2022-08-31 MED ORDER — CRISABOROLE 2 % EX OINT: 1.0000 "application " | TOPICAL_OINTMENT | Freq: Two times a day (BID) | CUTANEOUS | 5 refills | Status: DC | PRN

## 2022-08-31 NOTE — Progress Notes (Signed)
Follow-up Note  RE: Cindy Zimmerman MRN: [id number] DOB: 08-Jul-1965 Date of Office Visit: 08/31/2022   History of present illness: Cindy Zimmerman is a 57 y.o. female presenting today for follow-up of allergic rhinitis with conjunctivitis, food allergy and pruritus.  She was last seen in the office on 07/14/2021 by myself.  She has not had any major health changes, surgeries or hospitalizations since this visit.  She has an upcoming trip planned to [place].  She is excited about this trip but she does recall the other time she happened to be in [place] for approx 3 hour layover she recalls stepping out of the plane and smelling seafood smell in the ER.  She has a fish and shellfish allergy.  She states the smell of this at that time it caused her to feel like she was having some mild reaction symptoms.  She is a little bit concerned about swelling of [place]  She does have access to her epinephrine device.  She also avoids strawberry. She is not noting any significant allergy symptoms at this time.  However she feels like symptoms may increase, once the weather is warmer and the pollen is more visible.  She will use an antihistamine like Allegra Zyrtec or Xyzal when needed.  She also has to use allergy with eyedrops like Pataday as well as nasal spray every for this in the past.  Not currently needing any medications at this time. She has a 40 further itching as she has had in the past we have used [place] for.    Review of systems past 4 weeks: Review of Systems  Constitutional: Negative.   HENT: Negative.    Eyes: Negative.   Respiratory: Negative.    Cardiovascular: Negative.   Gastrointestinal: Negative.   Musculoskeletal: Negative.   Skin: Negative.   Allergic/Immunologic: Negative.   Neurological: Negative.      All other systems negative unless noted above in HPI  Past medical/social/surgical/family history have been reviewed and are  unchanged unless specifically indicated below.  No changes  Medication List: Current Outpatient Medications  Medication Sig Dispense Refill   AUVI-Q 0.3 MG/0.3ML SOAJ injection Inject 0.3 mg into the muscle as needed for anaphylaxis. 1 each 1   EPINEPHrine (AUVI-Q) 0.3 mg/0.3 mL IJ SOAJ injection Inject 0.3 mg into the muscle as needed for anaphylaxis. 1 each 1   hydrochlorothiazide (HYDRODIURIL) 12.5 MG tablet Take 1 tablet (12.5 mg total) by mouth daily. 90 tablet 3   lisinopril (ZESTRIL) 40 MG tablet TAKE 1 TABLET BY MOUTH EVERY DAY 90 tablet 1   Olopatadine HCl (PATADAY) 0.2 % SOLN Place 1 drop into both eyes daily as needed. 2.5 mL 5   Crisaborole 2 % OINT Apply 1 application  topically 2 (two) times daily as needed (Itch or rash). 60 g 5   Multiple Vitamin (MULTIVITAMIN) tablet Take 1 tablet by mouth daily. (Patient not taking: Reported on 08/31/2022)     No current facility-administered medications for this visit.     Known medication allergies: Allergies  Allergen Reactions   Fish Allergy Anaphylaxis and Hives   Strawberry (Diagnostic) Anaphylaxis, Swelling and Other (See Comments)    Eyes swell   Tamiflu [Oseltamivir] Hives     Physical examination: Blood pressure (!) 126/92, pulse 83, temperature 98.1 F (36.7 C), temperature source Temporal, resp. rate 18, height 4' 11.5" (1.511 m), weight 167 lb 12.8 oz (76.1 kg), last menstrual period  07/12/1999, SpO2 96 %.  General: Alert, interactive, in no acute distress. HEENT: PERRLA, TMs pearly gray, turbinates non-edematous without discharge, post-pharynx unremarkable. Neck: Supple without lymphadenopathy. Lungs: Clear to auscultation without wheezing, rhonchi or rales. {no increased work of breathing. CV: Normal S1, S2 without murmurs. Abdomen: Nondistended, nontender. Skin: Warm and dry, without lesions or rashes. Extremities:  No clubbing, cyanosis or edema. Neuro:   Grossly intact.  Diagnositics/Labs: None  today  Assessment and plan: Allergic rhinitis with conjunctivitis   - continue avoidance measures for weed pollen, tree pollen and molds   - use of Allegra 180mg , Zyrtec 10mg  or Xyzal 5mg  daily.  Provided with samples of Xyzal today.   - for itchy/watery/eyes can use of allergy eyedrops like Pataday which is over-the-counter.     - for nasal drainage use Ryaltris 2 sprays each nostril 1-2 times a day  Food allergy    - continue avoidance of all fish/shellfish and strawberry    - have access to self-injectable epinephrine  AuviQ 0.3mg  at all times.  Device refill sent in today.    - follow emergency action plan in case of allergic reaction    - provided with a prednisone pack today to take with her on her travels in case she were to experience allergic reaction symptoms she first is to use her epinephrine device if it warrants use however she can take the prednisone 10 mg 2 tablets daily for 5 days if needed  Pruritus    - improved    - continue as needed use of Eucrisa to itchy areas  Follow- up 6-12 months or sooner if needed  I appreciate the opportunity to take part in Cindy Zimmerman's care. Please do not hesitate to contact me with questions.  Sincerely,   Prudy Feeler, MD Allergy/Immunology [facility]

## 2022-08-31 NOTE — Patient Instructions (Addendum)
Allergic rhinitis with conjunctivitis   - continue avoidance measures for weed pollen, tree pollen and molds   - use of Allegra 180mg , Zyrtec 10mg  or Xyzal 5mg  daily.  Provided with samples of Xyzal today.   - for itchy/watery/eyes can use of allergy eyedrops like Pataday which is over-the-counter.     - for nasal drainage use Ryaltris 2 sprays each nostril 1-2 times a day  Food allergy    - continue avoidance of all fish/shellfish and strawberry    - have access to self-injectable epinephrine  AuviQ 0.3mg  at all times.  Device refill sent in today.    - follow emergency action plan in case of allergic reaction    - provided with a prednisone pack today to take with her on her travels in case she were to experience allergic reaction symptoms she first is to use her epinephrine device if it warrants use however she can take the prednisone 10 mg 2 tablets daily for 5 days if needed  Pruritus    - improved    - continue as needed use of Eucrisa to itchy areas  Follow- up 6-12 months or sooner if needed

## 2022-09-01 ENCOUNTER — Other Ambulatory Visit: Payer: Self-pay | Admitting: Allergy

## 2022-09-04 ENCOUNTER — Telehealth: Payer: Self-pay

## 2022-09-04 ENCOUNTER — Other Ambulatory Visit (HOSPITAL_COMMUNITY): Payer: Self-pay

## 2022-09-04 NOTE — Telephone Encounter (Signed)
PA has been APPROVED from 09/04/2022-09/03/2023

## 2022-09-04 NOTE — Telephone Encounter (Signed)
PA request received via provider for Eucrisa 2% ointment  PA has been submitted to [facility] and is pending determination.  Key: [id number]

## 2022-09-04 NOTE — Telephone Encounter (Signed)
PA has been submitted and will be updated in additional encounter created.  

## 2022-10-06 ENCOUNTER — Other Ambulatory Visit: Payer: Self-pay | Admitting: Family Medicine

## 2022-10-06 DIAGNOSIS — Z1231 Encounter for screening mammogram for malignant neoplasm of breast: Secondary | ICD-10-CM

## 2022-11-08 ENCOUNTER — Ambulatory Visit: Payer: BC Managed Care – PPO

## 2022-11-23 ENCOUNTER — Ambulatory Visit: Payer: BC Managed Care – PPO

## 2022-11-30 ENCOUNTER — Ambulatory Visit
Admission: RE | Admit: 2022-11-30 | Discharge: 2022-11-30 | Disposition: A | Discharge status: Home / Self Care | Payer: BC Managed Care – PPO | Source: Ambulatory Visit | Attending: Family Medicine | Admitting: Family Medicine

## 2022-11-30 DIAGNOSIS — Z1231 Encounter for screening mammogram for malignant neoplasm of breast: Secondary | ICD-10-CM

## 2022-12-24 ENCOUNTER — Other Ambulatory Visit: Payer: Self-pay | Admitting: Family Medicine

## 2022-12-24 DIAGNOSIS — I10 Essential (primary) hypertension: Secondary | ICD-10-CM

## 2023-01-23 ENCOUNTER — Ambulatory Visit (INDEPENDENT_AMBULATORY_CARE_PROVIDER_SITE_OTHER): Payer: BC Managed Care – PPO | Admitting: Radiology

## 2023-01-23 ENCOUNTER — Encounter: Payer: Self-pay | Admitting: Radiology

## 2023-01-23 VITALS — BP 122/84 | Ht 59.0 in | Wt 172.0 lb

## 2023-01-23 DIAGNOSIS — Z01419 Encounter for gynecological examination (general) (routine) without abnormal findings: Secondary | ICD-10-CM | POA: Diagnosis not present

## 2023-01-23 NOTE — Progress Notes (Signed)
   Cindy Zimmerman [birth year] [id number]   History:  57 y.o. G0 presents for annual exam. S/p TAH for fibroids. No gyn concerns. 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  Gynecologic History Hysterectomy: fibroids  Sexually active: no  Health Maintenance Last Pap: 2022. Results were: normal Last mammogram: 2024. Results were: normal Last colonoscopy: 2018.    Past medical history, past surgical history, family history and social history were all reviewed and documented in the EPIC chart.  ROS:  A ROS was performed and pertinent positives and negatives are included.  Exam:  Vitals:   01/23/23 1457  BP: 122/84  Weight: 172 lb (78 kg)  Height: 4\' 11"  (1.499 m)   Body mass index is 34.74 kg/m.  General appearance:  Normal Thyroid:  Symmetrical, normal in size, without palpable masses or nodularity. Respiratory  Auscultation:  Clear without wheezing or rhonchi Cardiovascular  Auscultation:  Regular rate, without rubs, murmurs or gallops  Edema/varicosities:  Not grossly evident Abdominal  Soft,nontender, without masses, guarding or rebound.  Liver/spleen:  No organomegaly noted  Hernia:  None appreciated  Skin  Inspection:  Grossly normal Breasts: Examined lying and sitting.   Right: Without masses, retractions, nipple discharge or axillary adenopathy.   Left: Without masses, retractions, nipple discharge or axillary adenopathy. Genitourinary   Inguinal/mons:  Normal without inguinal adenopathy  External genitalia:  Normal appearing vulva with no masses, tenderness, or lesions  BUS/Urethra/Skene's glands:  Normal  Vagina:  Normal appearing with normal color and discharge, no lesions.   Cervix:  absent  Uterus:  absent  Adnexa/parametria:     Rt: Normal in size, without masses or tenderness.   Lt: Normal in size, without masses or tenderness.  Anus and perineum: Normal   Raynelle Fanning, CMA present for exam  Assessment/Plan:   1. Well woman exam with routine gynecological exam Labs with  PCP     Discussed SBE, colonoscopy and DEXA screening as appropriate. Encouraged 161mins/week of cardiovascular and weight bearing exercise minimum. Recommend the use of seatbelts and sunscreen consistently.   Return in 1 year for annual or sooner prn.  Arlie Solomons B WHNP-BC 3:22 PM 01/23/2023

## 2023-02-15 ENCOUNTER — Encounter: Payer: Self-pay | Admitting: Family Medicine

## 2023-02-15 ENCOUNTER — Ambulatory Visit: Payer: BC Managed Care – PPO | Admitting: Family Medicine

## 2023-02-15 VITALS — BP 126/80 | HR 72 | Temp 98.4°F | Ht 59.0 in | Wt 171.0 lb

## 2023-02-15 DIAGNOSIS — J0100 Acute maxillary sinusitis, unspecified: Secondary | ICD-10-CM

## 2023-02-15 DIAGNOSIS — I10 Essential (primary) hypertension: Secondary | ICD-10-CM | POA: Diagnosis not present

## 2023-02-15 DIAGNOSIS — R051 Acute cough: Secondary | ICD-10-CM

## 2023-02-15 DIAGNOSIS — R0981 Nasal congestion: Secondary | ICD-10-CM | POA: Diagnosis not present

## 2023-02-15 MED ORDER — CEPHALEXIN 500 MG PO CAPS
500.0000 mg | ORAL_CAPSULE | Freq: Two times a day (BID) | ORAL | 0 refills | Status: AC
Start: 2023-02-15 — End: 2023-02-22

## 2023-02-15 MED ORDER — BENZONATATE 200 MG PO CAPS
200.0000 mg | ORAL_CAPSULE | Freq: Two times a day (BID) | ORAL | 0 refills | Status: DC | PRN
Start: 2023-02-15 — End: 2023-07-09

## 2023-02-15 NOTE — Progress Notes (Signed)
Established Patient Office Visit   Subjective  Patient ID: Cindy Zimmerman, female    DOB: [birth year]  Age: 57 y.o. MRN: [id number]  Chief Complaint  Patient presents with   Cough    Cough congestion and mucus, started aug 12th     Patient is a 57 year old female seen for ongoing concern.  Patient endorses fatigue, nasal congestion, productive cough, drainage since August 12.  Patient had facial pain 1 week ago.  Tried over-the-counter medications for symptoms without improvement.    Past Medical History:  Diagnosis Date   Abnormal Pap smear of cervix    Anemia    due to fibroid tumors   Fibroid    Hypertension    Leiomyoma    Prediabetes 01/23/2020   Urticaria    fish   Past Surgical History:  Procedure Laterality Date   ABDOMINAL HYSTERECTOMY     TAH/LOA   BREAST BIOPSY Right 2017   CHOLECYSTECTOMY     COLONOSCOPY     HYSTEROSCOPY  09/1998   fibroid hysteroscopic resection   MYOMECTOMY  12/1994   multiple   ROBOTIC ASSISTED LAPAROSCOPIC VAGINAL HYSTERECTOMY WITH FIBROID REMOVAL     Social History   Tobacco Use   Smoking status: Never    Passive exposure: Never   Smokeless tobacco: Never  Vaping Use   Vaping status: Never Used  Substance Use Topics   Alcohol use: No    Alcohol/week: 0.0 standard drinks of alcohol   Drug use: No   Family History  Problem Relation Age of Onset   Breast cancer Mother 44   Hypertension Mother    Thyroid disease Mother    Lung cancer Mother    Hypertension Father    Diabetes Father    Diabetes Paternal Aunt    Cancer Paternal Uncle        bone   Cancer Paternal Grandmother        bone   Urticaria Neg Hx    Allergies  Allergen Reactions   Fish Allergy Anaphylaxis and Hives   Strawberry (Diagnostic) Anaphylaxis, Swelling and Other (See Comments)    Eyes swell   Tamiflu [Oseltamivir] Hives      ROS Negative unless stated above    Objective:     BP 126/80 (BP Location: Left Arm, Patient Position: Sitting,  Cuff Size: Normal)   Pulse 72   Temp 98.4 F (36.9 C) (Oral)   Ht 4\' 11"  (1.499 m)   Wt 171 lb (77.6 kg)   LMP 07/12/1999 (Exact Date)   SpO2 97%   BMI 34.54 kg/m  BP Readings from Last 3 Encounters:  02/15/23 126/80  01/23/23 122/84  08/31/22 (!) 126/92   Wt Readings from Last 3 Encounters:  02/15/23 171 lb (77.6 kg)  01/23/23 172 lb (78 kg)  08/31/22 167 lb 12.8 oz (76.1 kg)      Physical Exam Constitutional:      General: She is not in acute distress.    Appearance: Normal appearance.  HENT:     Head: Normocephalic and atraumatic.     Nose:     Right Turbinates: Enlarged and swollen.     Left Turbinates: Enlarged and swollen.     Left Sinus: Maxillary sinus tenderness present.     Mouth/Throat:     Mouth: Mucous membranes are moist.  Cardiovascular:     Rate and Rhythm: Normal rate and regular rhythm.     Heart sounds: Normal heart sounds. No murmur heard.  No gallop.  Pulmonary:     Effort: Pulmonary effort is normal. No respiratory distress.     Breath sounds: Normal breath sounds. No wheezing, rhonchi or rales.  Skin:    General: Skin is warm and dry.  Neurological:     Mental Status: She is alert and oriented to person, place, and time.      No results found for any visits on 02/15/23.    Assessment & Plan:  Acute maxillary sinusitis, recurrence not specified -     Cephalexin; Take 1 capsule (500 mg total) by mouth 2 (two) times daily for 7 days.  Dispense: 14 capsule; Refill: 0  Nasal congestion -     POC COVID-19 BinaxNow -     POCT Influenza A/B  Acute cough -     Benzonatate; Take 1 capsule (200 mg total) by mouth 2 (two) times daily as needed for cough.  Dispense: 20 capsule; Refill: 0  Essential hypertension -BP well-controlled -Continue HCTZ 12.5 mg and lisinopril 40 mg daily. -Continue lifestyle modifications.  COVID and flu testing negative.  Start ABX for sinusitis.  Tessalon for cough.  Continue OTC supportive care.  Follow-up as  needed.  No follow-ups on file.   Deeann Saint, MD

## 2023-05-13 ENCOUNTER — Other Ambulatory Visit: Payer: Self-pay | Admitting: Family Medicine

## 2023-05-13 DIAGNOSIS — I10 Essential (primary) hypertension: Secondary | ICD-10-CM

## 2023-07-06 ENCOUNTER — Ambulatory Visit: Payer: Self-pay | Admitting: Family Medicine

## 2023-07-06 NOTE — Telephone Encounter (Signed)
  Chief Complaint: URI symptoms Symptoms: cough, headache, coughing up mucus Frequency: 07/05/2023 Pertinent Negatives: Patient denies fever, CP,  Disposition: [] ED /[] Urgent Care (no appt availability in office) / [] Appointment(In office/virtual)/ []  [facility]/ [] Home Care/ [x] Refused Recommended Disposition /[] [facility]/ []  Follow-up with PCP Additional Notes: patient who is a Runner, broadcasting/film/video, called with c/o cough, headache and coughing up mucus. Patient states symptoms started yesterday. Per protocol, Urgent Care or Virtual UC was recommended. Patient refused saying "I am so sick and tired of remote. I am going to try with OTC medications until I can see my doctor." Patient verbalized understanding and all questions answered.   Copied from CRM [id number]. Topic: Clinical - Red Word Triage >> Jul 06, 2023  3:40 PM Gurney Maxin H wrote: [facility] that prompted transfer to Nurse Triage: Cough, cold all the time, headache, chest hurts when she coughs and coughing up mucus. Reason for Disposition  [1] Continuous (nonstop) coughing interferes with work or school AND [2] no improvement using cough treatment per Care Advice  Answer Assessment - Initial Assessment Questions 1. ONSET: "When did the cough begin?"      Started yesterday 2. 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 SPUTUM: "Describe the color of your sputum" (none, dry cough; clear, white, yellow, green)     green 4. HEMOPTYSIS: "Are you coughing up any blood?" If so ask: "How much?" (flecks, streaks, tablespoons, etc.)     no 5. DIFFICULTY BREATHING: "Are you having difficulty breathing?" If Yes, ask: "How bad is it?" (e.g., mild, moderate, severe)    - MILD: No SOB at rest, mild SOB with walking, speaks normally in sentences, can lie down, no retractions, pulse < 100.    - MODERATE: SOB at rest, SOB with minimal exertion and prefers to sit,  cannot lie down flat, speaks in phrases, mild retractions, audible wheezing, pulse 100-120.    - SEVERE: Very SOB at rest, speaks in single words, struggling to breathe, sitting hunched forward, retractions, pulse > 120      No 6. FEVER: "Do you have a fever?" If Yes, ask: "What is your temperature, how was it measured, and when did it start?"     No 7. CARDIAC HISTORY: "Do you have any history of heart disease?" (e.g., heart attack, congestive heart failure)      No 8. LUNG HISTORY: "Do you have any history of lung disease?"  (e.g., pulmonary embolus, asthma, emphysema)     no 9. PE RISK FACTORS: "Do you have a history of blood clots?" (or: recent major surgery, recent prolonged travel, bedridden)     no 10. OTHER SYMPTOMS: "Do you have any other symptoms?" (e.g., runny nose, wheezing, chest pain)       Runny nose 12. TRAVEL: "Have you traveled out of the country in the last month?" (e.g., travel history, exposures)       No  Protocols used: Cough - Acute Productive-A-AH

## 2023-07-06 NOTE — Telephone Encounter (Signed)
Called and sch patient for 07/09/23

## 2023-07-09 ENCOUNTER — Encounter: Payer: Self-pay | Admitting: Family Medicine

## 2023-07-09 ENCOUNTER — Ambulatory Visit: Payer: Self-pay | Admitting: Family Medicine

## 2023-07-09 VITALS — BP 140/78 | HR 93 | Temp 99.2°F | Ht 59.0 in | Wt 172.0 lb

## 2023-07-09 DIAGNOSIS — J069 Acute upper respiratory infection, unspecified: Secondary | ICD-10-CM

## 2023-07-09 DIAGNOSIS — R051 Acute cough: Secondary | ICD-10-CM | POA: Diagnosis not present

## 2023-07-09 DIAGNOSIS — R0981 Nasal congestion: Secondary | ICD-10-CM | POA: Diagnosis not present

## 2023-07-09 DIAGNOSIS — H66001 Acute suppurative otitis media without spontaneous rupture of ear drum, right ear: Secondary | ICD-10-CM | POA: Diagnosis not present

## 2023-07-09 DIAGNOSIS — R062 Wheezing: Secondary | ICD-10-CM

## 2023-07-09 LAB — POCT INFLUENZA A/B
Influenza A, POC: NEGATIVE
Influenza B, POC: NEGATIVE

## 2023-07-09 LAB — POC COVID19 BINAXNOW: SARS Coronavirus 2 Ag: NEGATIVE

## 2023-07-09 MED ORDER — AMOXICILLIN-POT CLAVULANATE 875-125 MG PO TABS
1.0000 | ORAL_TABLET | Freq: Two times a day (BID) | ORAL | 0 refills | Status: AC
Start: 2023-07-09 — End: 2023-07-16

## 2023-07-09 MED ORDER — BENZONATATE 200 MG PO CAPS: 200.0000 mg | ORAL_CAPSULE | Freq: Two times a day (BID) | ORAL | 0 refills | Status: DC | PRN

## 2023-07-09 MED ORDER — ALBUTEROL SULFATE HFA 108 (90 BASE) MCG/ACT IN AERS
2.0000 | INHALATION_SPRAY | Freq: Four times a day (QID) | RESPIRATORY_TRACT | 0 refills | Status: DC | PRN
Start: 2023-07-09 — End: 2023-07-31

## 2023-07-09 NOTE — Progress Notes (Signed)
Established Patient Office Visit   Subjective  Patient ID: Cindy Zimmerman, female    DOB: 1965-11-08  Age: 58 y.o. MRN: [id number]  No chief complaint on file.   Patient is a 58 year old female seen for acute concern.  Patient endorses nasal congestion, cough, headache, subjective fever, sore throat, right ear pain x 5 days.  Denies rhinorrhea but notes increased mucus production sneezes or has to blow nose.  Patient has been feeling so bad that she has not been eating or drinking much.  Tries to sleep but cough keeps her up at night and causes soreness in chest.  Pt notes mild improvement in symptoms since last Thursday.  Was taking Tylenol.  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    Patient Active Problem List   Diagnosis Date Noted   Prediabetes 01/23/2020   Hypertensive disorder 04/06/2017   Vitamin D deficiency 04/06/2017   Iron deficiency anemia 04/06/2017   Past Medical History:  Diagnosis Date   Abnormal Pap smear of cervix    Anemia    due to fibroid tumors   Fibroid    Hypertension    Leiomyoma    Prediabetes 01/23/2020   Urticaria    fish   Past Surgical History:  Procedure Laterality Date   ABDOMINAL HYSTERECTOMY     TAH/LOA   BREAST BIOPSY Right 2017   CHOLECYSTECTOMY     COLONOSCOPY     HYSTEROSCOPY  09/1998   fibroid hysteroscopic resection   MYOMECTOMY  12/1994   multiple   ROBOTIC ASSISTED LAPAROSCOPIC VAGINAL HYSTERECTOMY WITH FIBROID REMOVAL     Social History   Tobacco Use   Smoking status: Never    Passive exposure: Never   Smokeless tobacco: Never  Vaping Use   Vaping status: Never Used  Substance Use Topics   Alcohol use: No    Alcohol/week: 0.0 standard drinks of alcohol   Drug use: No   Family History  Problem Relation Age of Onset   Breast cancer Mother 37   Hypertension Mother    Thyroid disease Mother    Lung cancer Mother    Hypertension Father    Diabetes Father    Diabetes Paternal Aunt     Cancer Paternal Uncle        bone   Cancer Paternal Grandmother        bone   Urticaria Neg Hx    Allergies  Allergen Reactions   Fish Allergy Anaphylaxis and Hives   Strawberry (Diagnostic) Anaphylaxis, Swelling and Other (See Comments)    Eyes swell   Tamiflu [Oseltamivir] Hives      ROS Negative unless stated above    Objective:     BP (!) 140/78 (BP Location: Left Arm, Patient Position: Sitting, Cuff Size: Large)   Pulse 93   Temp 99.2 F (37.3 C) (Oral)   Ht 4\' 11"  (1.499 m)   Wt 172 lb (78 kg)   LMP 07/12/1999 (Exact Date)   SpO2 95%   BMI 34.74 kg/m  BP Readings from Last 3 Encounters:  07/09/23 (!) 140/78  02/15/23 126/80  01/23/23 122/84   Wt Readings from Last 3 Encounters:  07/09/23 172 lb (78 kg)  02/15/23 171 lb (77.6 kg)  01/23/23 172 lb (78 kg)      Physical Exam Constitutional:      General: She is not in acute distress.    Appearance: Normal appearance. She is ill-appearing.  HENT:     Head:  Normocephalic and atraumatic.     Right Ear: Tympanic membrane is erythematous and bulging.     Left Ear: Tympanic membrane normal.     Nose: Nose normal.     Mouth/Throat:     Mouth: Mucous membranes are moist.  Cardiovascular:     Rate and Rhythm: Normal rate and regular rhythm.     Heart sounds: Normal heart sounds. No murmur heard.    No gallop.  Pulmonary:     Effort: Pulmonary effort is normal. No respiratory distress.     Breath sounds: Examination of the right-upper field reveals wheezing. Examination of the left-upper field reveals wheezing. Examination of the right-middle field reveals wheezing. Examination of the right-lower field reveals wheezing. Examination of the left-lower field reveals wheezing. Wheezing present. No rhonchi or rales.  Skin:    General: Skin is warm and dry.  Neurological:     Mental Status: She is alert and oriented to person, place, and time. Mental status is at baseline.    Results for orders placed or  performed in visit on 07/09/23  POC COVID-19  Result Value Ref Range   SARS Coronavirus 2 Ag Negative Negative  POC Influenza A/B  Result Value Ref Range   Influenza A, POC Negative Negative   Influenza B, POC Negative Negative      Assessment & Plan:  Acute suppurative otitis media of right ear without spontaneous rupture of tympanic membrane, recurrence not specified -     Amoxicillin-Pot Clavulanate; Take 1 tablet by mouth 2 (two) times daily for 7 days.  Dispense: 14 tablet; Refill: 0  Acute cough -     POC COVID-19 BinaxNow -     POCT Influenza A/B -     Albuterol Sulfate HFA; Inhale 2 puffs into the lungs every 6 (six) hours as needed for wheezing or shortness of breath.  Dispense: 8 g; Refill: 0 -     Benzonatate; Take 1 capsule (200 mg total) by mouth 2 (two) times daily as needed for cough.  Dispense: 20 capsule; Refill: 0  Wheezing -     Albuterol Sulfate HFA; Inhale 2 puffs into the lungs every 6 (six) hours as needed for wheezing or shortness of breath.  Dispense: 8 g; Refill: 0  Nasal congestion  Viral URI  Patient with acute viral URI symptoms x 5 days.  POC COVID and flu testing negative.  Start ABX for right AOM.  Albuterol inhaler as needed for wheezing.  Tessalon for cough.  Continue Tylenol and other OTC cough/cold medications as needed.  Discussed the importance of staying hydrated.  Given strict precautions for continued or worsening symptoms.  Return if symptoms worsen or fail to improve.   Deeann Saint, MD

## 2023-07-09 NOTE — Patient Instructions (Addendum)
A prescription for Augmentin, an antibiotic was sent to your pharmacy.  This is to help with your infection.  You can continue taking Tylenol as needed for fever/pain/discomfort.  Is important that you stay hydrated.

## 2023-07-28 IMAGING — MG MM DIGITAL SCREENING BILAT W/ TOMO AND CAD
8 series · 8 of 24 positions shown · non-contrast
Comparison: Previous exam(s).

CLINICAL DATA: Screening.

EXAM:
DIGITAL SCREENING BILATERAL MAMMOGRAM WITH TOMOSYNTHESIS AND CAD
TECHNIQUE: Bilateral screening digital craniocaudal and mediolateral oblique
mammograms were obtained. Bilateral screening digital breast
tomosynthesis was performed. The images were evaluated with
computer-aided detection.

[L MLO synth-2D]
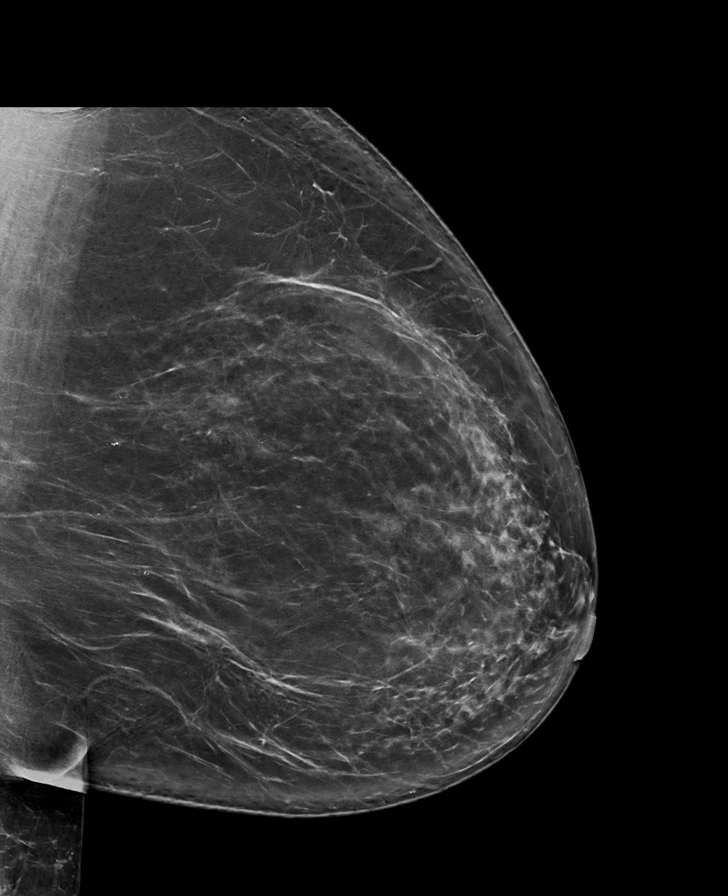

[R MLO synth-2D]
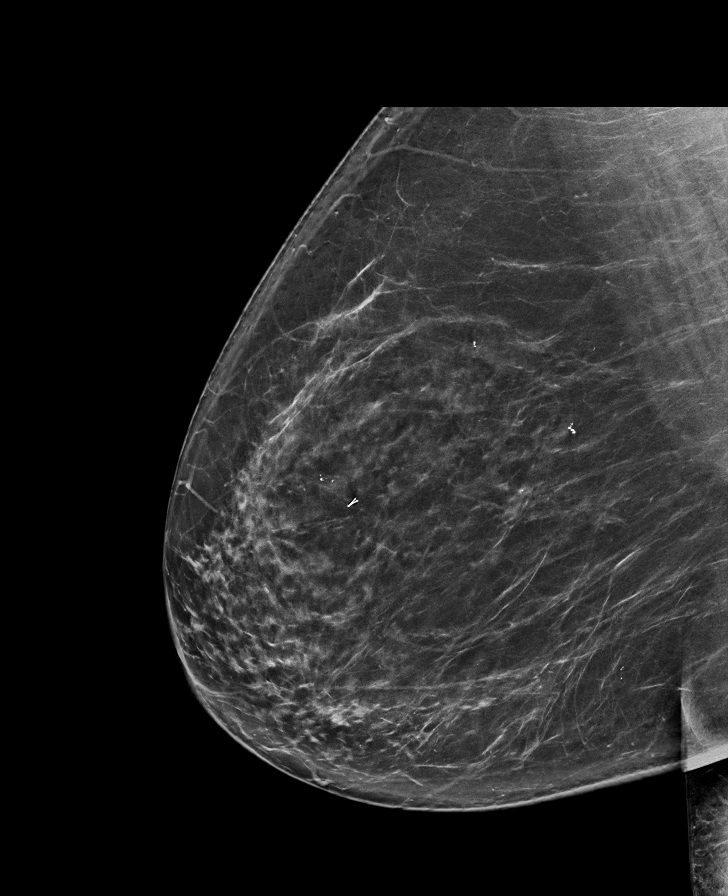

[L CC synth-2D]
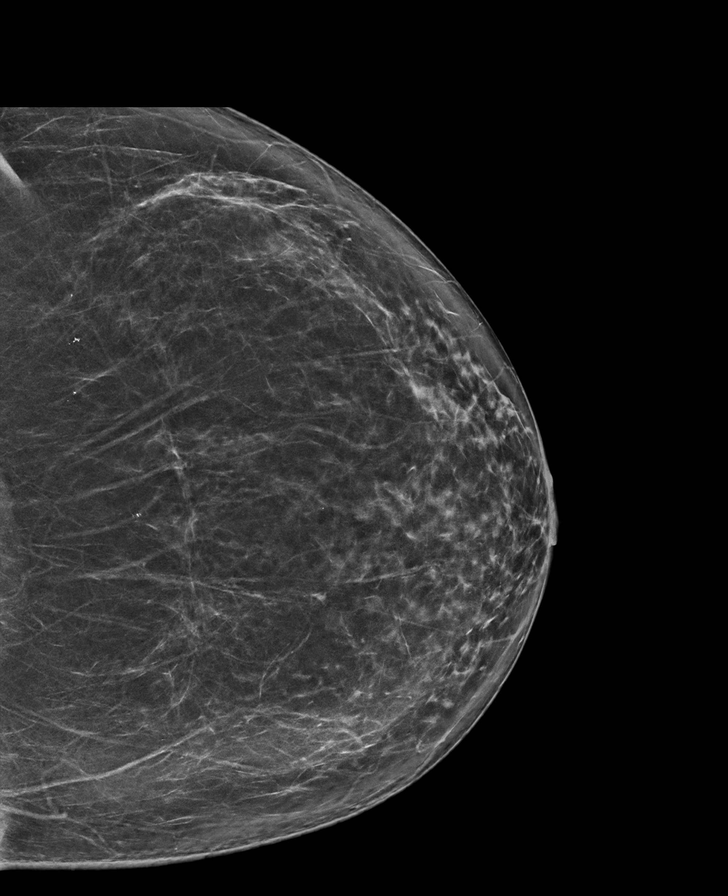

[R CC synth-2D]
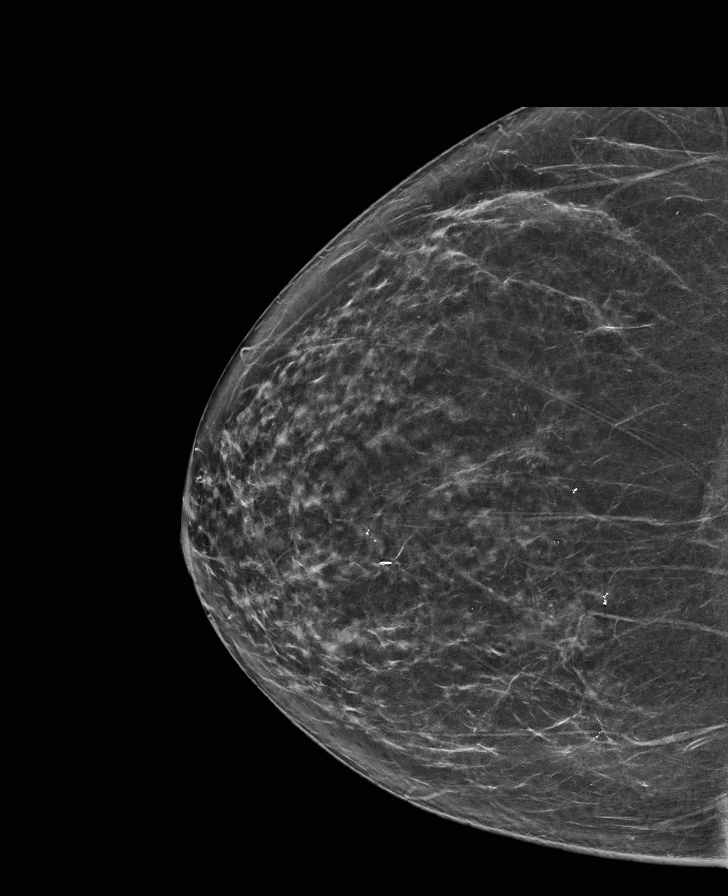

[R CC tomo · tomo slice 41/81.0]
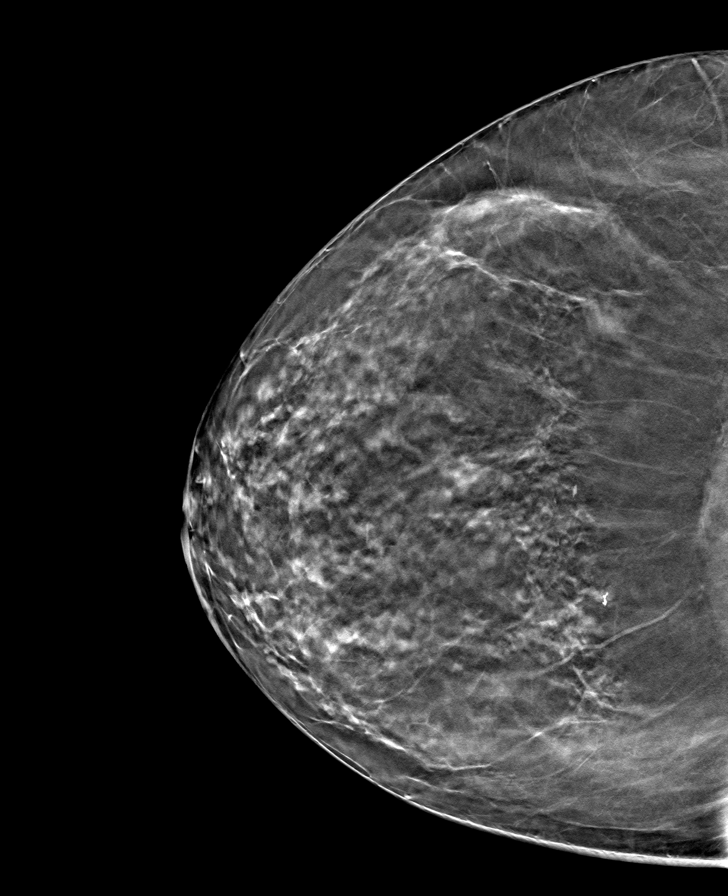

[L CC tomo · tomo slice 44/87.0]
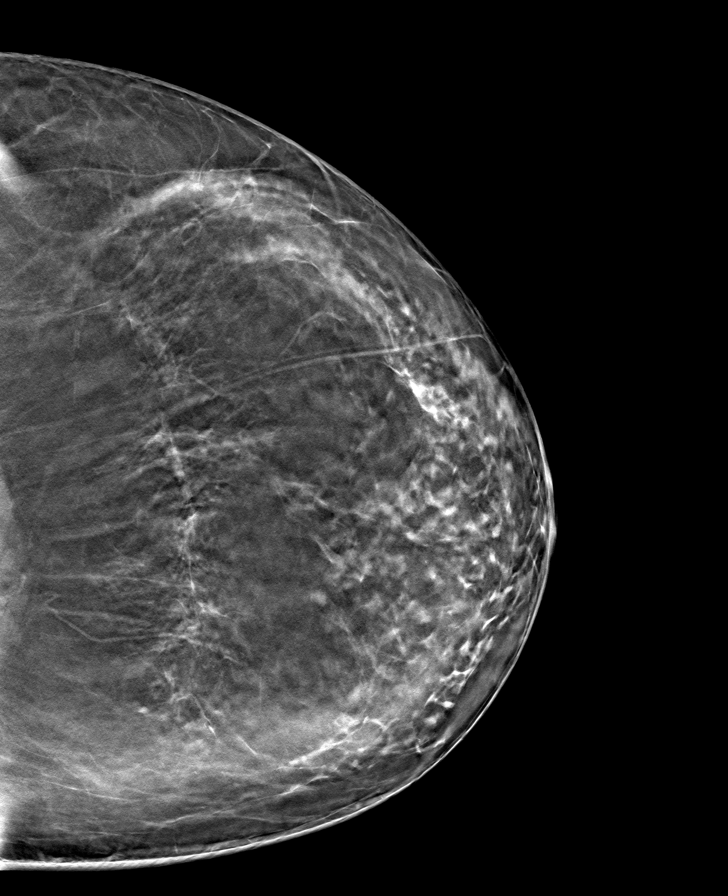

[L MLO tomo · tomo slice 48/95.0]
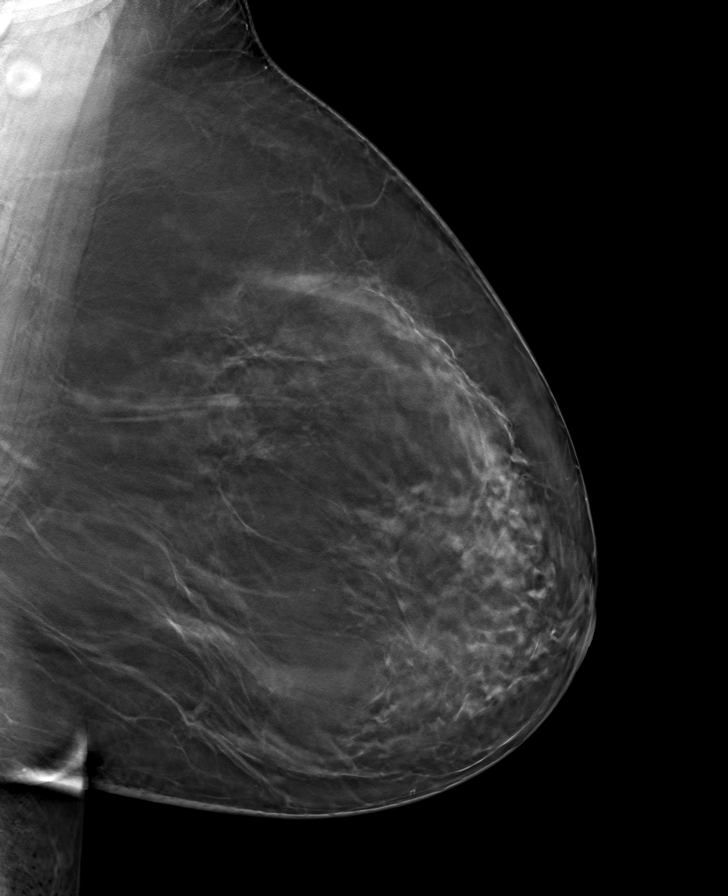

[R MLO tomo · tomo slice 45/88.0]
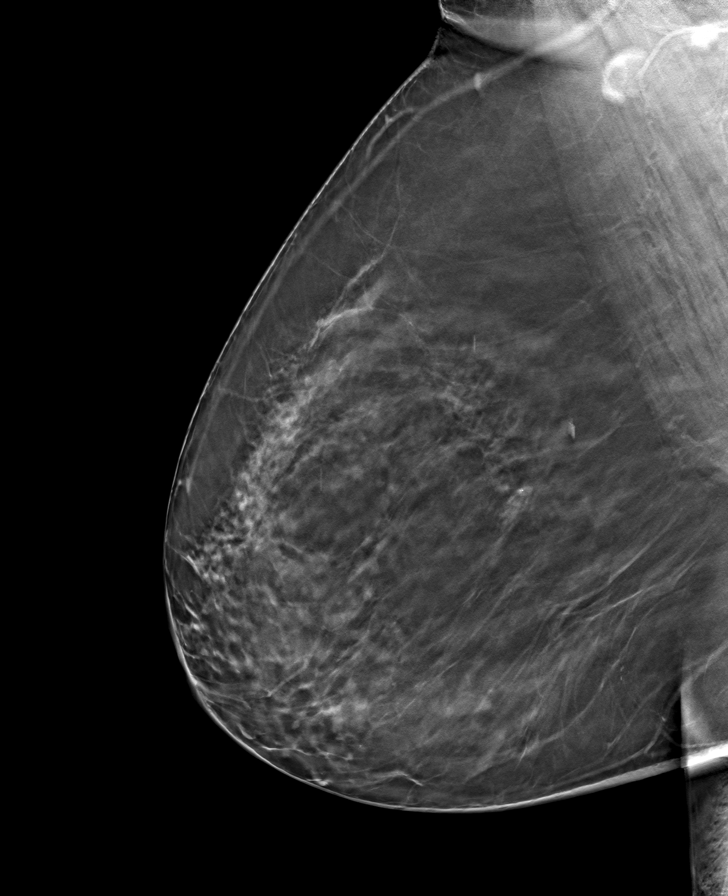

[8 of 24 positions shown; findings below may reference images not displayed]

ACR Breast Density Category b: There are scattered areas of
fibroglandular density.
FINDINGS: There are no findings suspicious for malignancy.
IMPRESSION: No mammographic evidence of malignancy. A result letter of this
screening mammogram will be mailed directly to the patient.

RECOMMENDATION:
Screening mammogram in one year. (Code:51-O-LD2)

BI-RADS CATEGORY  1: Negative.

## 2023-07-31 ENCOUNTER — Other Ambulatory Visit: Payer: Self-pay | Admitting: Family Medicine

## 2023-07-31 DIAGNOSIS — R051 Acute cough: Secondary | ICD-10-CM

## 2023-07-31 DIAGNOSIS — R062 Wheezing: Secondary | ICD-10-CM

## 2023-09-24 ENCOUNTER — Telehealth: Payer: Self-pay

## 2023-09-24 NOTE — Transitions of Care (Post Inpatient/ED Visit) (Signed)
   09/24/2023  Name: Cindy Zimmerman MRN: [id number] DOB: [birth year]  Today's TOC FU Call Status: Today's TOC FU Call Status:: Successful TOC FU Call Completed TOC FU Call Complete Date: 09/24/23 Patient's Name and Date of Birth confirmed.  Transition Care Management Follow-up Telephone Call Date of Discharge: 09/22/23 Discharge Facility: Other [facility]) Name of Other (Non[facility]) Discharge Facility: [facility] Type of Discharge: Inpatient Admission How have you been since you were released from the hospital?: Better Any questions or concerns?: No  Items Reviewed: Did you receive and understand the discharge instructions provided?: Yes Medications obtained,verified, and reconciled?: Yes (Medications Reviewed) Any new allergies since your discharge?: No Dietary orders reviewed?: Yes Do you have support at home?: Yes People in Home [RPT]: parent(s)  Medications Reviewed Today: Medications Reviewed Today     Reviewed by Karena Addison, LPN (Licensed Practical Nurse) on 09/24/23 at 1031  Med List Status: <None>   Medication Order Taking? Sig Documenting Provider Last Dose Status Informant  albuterol (VENTOLIN HFA) 108 (90 Base) MCG/ACT inhaler [id number]  TAKE 2 PUFFS BY MOUTH EVERY 6 HOURS AS NEEDED FOR WHEEZE OR SHORTNESS OF BREATH Deeann Saint, MD  Active   AUVI-Q 0.3 MG/0.3ML SOAJ injection [id number]  Inject 0.3 mg into the muscle as needed for anaphylaxis.  Patient not taking: Reported on 07/09/2023   Marcelyn Bruins, MD  Active   benzonatate (TESSALON) 200 MG capsule [id number]  Take 1 capsule (200 mg total) by mouth 2 (two) times daily as needed for cough. Deeann Saint, MD  Active   CLEOCIN 150 MG capsule [id number]  Take 150 mg by mouth 4 (four) times daily. [provider]  Active   dexamethasone (DECADRON) 2 MG tablet [id number] Yes Take 2 mg by mouth 2 (two) times daily. [provider]  Active   hydrochlorothiazide (HYDRODIURIL)  12.5 MG tablet [id number]  TAKE 1 TABLET BY MOUTH EVERY DAY Deeann Saint, MD  Active   levETIRAcetam (KEPPRA) 500 MG tablet [id number] Yes Take 500 mg by mouth 2 (two) times daily. [provider]  Active   lisinopril (ZESTRIL) 40 MG tablet [id number]  TAKE 1 TABLET BY MOUTH EVERY DAY Deeann Saint, MD  Active   Multiple Vitamin (MULTIVITAMIN) tablet [id number]  Take 1 tablet by mouth daily. [provider]  Active   omeprazole (PRILOSEC) 20 MG capsule [id number] Yes Take 20 mg by mouth daily. Take two capsules [provider]  Active             Home Care and Equipment/Supplies: Were Home Health Services Ordered?: NA Any new equipment or medical supplies ordered?: NA  Functional Questionnaire: Do you need assistance with bathing/showering or dressing?: No Do you need assistance with meal preparation?: No Do you need assistance with eating?: No Do you have difficulty maintaining continence: No Do you need assistance with getting out of bed/getting out of a chair/moving?: No Do you have difficulty managing or taking your medications?: No  Follow up appointments reviewed: PCP Follow-up appointment confirmed?: Yes Date of PCP follow-up appointment?: 09/26/23 Follow-up Provider: [facility] Follow-up appointment confirmed?: Yes Date of Specialist follow-up appointment?: 09/27/23 Follow-Up Specialty Provider:: neuro Do you need transportation to your follow-up appointment?: No Do you understand care options if your condition(s) worsen?: Yes-patient verbalized understanding    SIGNATURE Karena Addison, LPN [facility] Nurse Health Advisor Direct Dial [phone]

## 2023-09-26 ENCOUNTER — Ambulatory Visit: Admitting: Family Medicine

## 2023-09-26 ENCOUNTER — Encounter: Payer: Self-pay | Admitting: Family Medicine

## 2023-09-26 VITALS — BP 160/90 | HR 95 | Temp 98.5°F | Wt 168.2 lb

## 2023-09-26 DIAGNOSIS — Z09 Encounter for follow-up examination after completed treatment for conditions other than malignant neoplasm: Secondary | ICD-10-CM

## 2023-09-26 DIAGNOSIS — R4701 Aphasia: Secondary | ICD-10-CM

## 2023-09-26 DIAGNOSIS — D329 Benign neoplasm of meninges, unspecified: Secondary | ICD-10-CM | POA: Diagnosis not present

## 2023-09-26 DIAGNOSIS — I10 Essential (primary) hypertension: Secondary | ICD-10-CM

## 2023-09-26 NOTE — Progress Notes (Signed)
 Established Patient Office Visit   Subjective  Patient ID: Cindy Zimmerman, female    DOB: [birth year]  Age: 59 y.o. MRN: [id number]  Chief Complaint  Patient presents with   Follow-up    Pt is a 58 yo female seen for HFU.  Pt sent to [facility] ED 09/21/23 then txf to [facility] 09/22/23 for brief aphasia while at work.  It was reported pt was not making sense or able to answer questions.  In [facility] ED pt back at baseline.  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  Given Decadron 10 mg IV then transferred to [facility].  Keppra initiated due to seizure concern.    Doing ok s/p d/c.  Has f/u with neurosurgery in 2 days. Has questions regarding driving as caring for her father.   Patient Active Problem List   Diagnosis Date Noted   Prediabetes 01/23/2020   Hypertensive disorder 04/06/2017   Vitamin D  deficiency 04/06/2017   Iron deficiency anemia 04/06/2017   Past Medical History:  Diagnosis Date   Abnormal Pap smear of cervix    Anemia    due to fibroid tumors   Fibroid    Hypertension    Leiomyoma    Prediabetes 01/23/2020   Urticaria    fish   Past Surgical History:  Procedure Laterality Date   ABDOMINAL HYSTERECTOMY     TAH/LOA   BREAST BIOPSY Right 2017   CHOLECYSTECTOMY     COLONOSCOPY     HYSTEROSCOPY  09/1998   fibroid hysteroscopic resection   MYOMECTOMY  12/1994   multiple   ROBOTIC ASSISTED LAPAROSCOPIC VAGINAL HYSTERECTOMY WITH FIBROID REMOVAL     Social History   Tobacco Use   Smoking status: Never    Passive exposure: Never   Smokeless tobacco: Never  Vaping Use   Vaping status: Never Used  Substance Use Topics   Alcohol use: No    Alcohol/week: 0.0 standard drinks of alcohol   Drug use: No   Family History  Problem Relation Age of Onset   Breast cancer Mother 51   Hypertension Mother    Thyroid  disease Mother    Lung cancer Mother    Hypertension Father    Diabetes Father    Diabetes  Paternal Aunt    Cancer Paternal Uncle        bone   Cancer Paternal Grandmother        bone   Urticaria Neg Hx    Allergies  Allergen Reactions   Fish Allergy Anaphylaxis and Hives   Strawberry (Diagnostic) Anaphylaxis, Swelling and Other (See Comments)    Eyes swell   Tamiflu [Oseltamivir] Hives      ROS Negative unless stated above    Objective:     BP (!) 160/90 (BP Location: Left Arm, Patient Position: Sitting, Cuff Size: Normal)   Pulse 95   Temp 98.5 F (36.9 C) (Oral)   Wt 168 lb 3.2 oz (76.3 kg)   LMP 07/12/1999 (Exact Date)   SpO2 96%   BMI 33.97 kg/m  BP Readings from Last 3 Encounters:  09/26/23 (!) 160/90  07/09/23 (!) 140/78  02/15/23 126/80   Wt Readings from Last 3 Encounters:  09/26/23 168 lb 3.2 oz (76.3 kg)  07/09/23 172 lb (78 kg)  02/15/23 171 lb (77.6 kg)      Physical Exam Constitutional:      General: She is not in acute distress.  Appearance: Normal appearance.  HENT:     Head: Normocephalic and atraumatic.     Nose: Nose normal.     Mouth/Throat:     Mouth: Mucous membranes are moist.     Tongue: Tongue does not deviate from midline.  Eyes:     General: No visual field deficit.    Extraocular Movements: Extraocular movements intact.     Right eye: Normal extraocular motion and no nystagmus.     Left eye: Normal extraocular motion and no nystagmus.     Conjunctiva/sclera: Conjunctivae normal.     Pupils: Pupils are equal, round, and reactive to light.  Cardiovascular:     Rate and Rhythm: Normal rate and regular rhythm.     Heart sounds: Normal heart sounds. No murmur heard.    No gallop.  Pulmonary:     Effort: Pulmonary effort is normal. No respiratory distress.     Breath sounds: Normal breath sounds. No wheezing, rhonchi or rales.  Skin:    General: Skin is warm and dry.  Neurological:     Mental Status: She is alert and oriented to person, place, and time.     Cranial Nerves: No facial asymmetry.    No results  found for any visits on 09/26/23.    Assessment & Plan:  Meningioma [facility])  Aphasia  Essential hypertension  Hospital discharge follow-up  Pt hospitalized 4/11-4/12/25.  TCM phone call made.  Notes, labs, imaging report reviewed.  Pt seen for HFU.  Aphasia now resolved.  A 3 x1.8 x 2.8 cm meningioma in L side middle sphenoid wing seen on CT and MRI head 09/22/23.  Compression of L temporal and frontal lobe noted.  Continue Keppra 500 mg BID d/t sz concern.  Discussed need for bp control.  Pt advised resection likely to be recommended.  Encouraged to keep appt with Neurosurgery.  Given strict ED precautions.  No follow-ups on file.   Viola Greulich, MD

## 2023-11-09 ENCOUNTER — Telehealth: Payer: Self-pay | Admitting: Family Medicine

## 2023-11-09 DIAGNOSIS — I10 Essential (primary) hypertension: Secondary | ICD-10-CM

## 2023-11-09 MED ORDER — HYDROCHLOROTHIAZIDE 12.5 MG PO TABS: 12.5000 mg | ORAL_TABLET | Freq: Every day | ORAL | 0 refills | Status: DC

## 2023-11-09 MED ORDER — LISINOPRIL 40 MG PO TABS
40.0000 mg | ORAL_TABLET | Freq: Every day | ORAL | 0 refills | Status: DC
Start: 2023-11-09 — End: 2024-03-10

## 2023-11-09 NOTE — Telephone Encounter (Signed)
 Copied from CRM [id number]. Topic: Clinical - Medication Refill >> Nov 09, 2023 12:22 PM Shardie S wrote: Medication: lisinopril  (ZESTRIL ) 40 MG tablet hydrochlorothiazide  (HYDRODIURIL ) 12.5 MG tablet  Has the patient contacted their pharmacy? Yes, contact PCP (Agent: If no, request that the patient contact the pharmacy for the refill. If patient does not wish to contact the pharmacy document the reason why and proceed with request.) (Agent: If yes, when and what did the pharmacy advise?)  This is the patient's preferred pharmacy:  [id number] [place], [place] - [address] [place] [address] Phone: [phone] Fax: [phone]   Is this the correct pharmacy for this prescription? Yes If no, delete pharmacy and type the correct one.   Has the prescription been filled recently? No  Is the patient out of the medication? Yes  Has the patient been seen for an appointment in the last year OR does the patient have an upcoming appointment? Yes  Can we respond through MyChart? no  Agent: Please be advised that Rx refills may take up to 3 business days. We ask that you follow-up with your pharmacy.

## 2023-11-13 HISTORY — PX: BRAIN SURGERY: SHX531

## 2023-12-03 ENCOUNTER — Other Ambulatory Visit: Payer: Self-pay | Admitting: Family Medicine

## 2023-12-03 DIAGNOSIS — Z1231 Encounter for screening mammogram for malignant neoplasm of breast: Secondary | ICD-10-CM

## 2023-12-05 ENCOUNTER — Ambulatory Visit
Admission: RE | Admit: 2023-12-05 | Discharge: 2023-12-05 | Discharge status: Home / Self Care | Source: Ambulatory Visit | Attending: Family Medicine

## 2023-12-05 DIAGNOSIS — Z1231 Encounter for screening mammogram for malignant neoplasm of breast: Secondary | ICD-10-CM

## 2024-01-11 ENCOUNTER — Ambulatory Visit: Payer: Self-pay | Admitting: Family Medicine

## 2024-01-11 ENCOUNTER — Telehealth: Payer: Self-pay | Admitting: Family Medicine

## 2024-01-11 NOTE — Telephone Encounter (Signed)
Called patient left a VM to return call

## 2024-01-11 NOTE — Telephone Encounter (Signed)
 Copied from CRM [id number]. Topic: General - Call Back - No Documentation >> Jan 11, 2024  1:14 PM Leah C wrote: Reason for CRM: Patient returned phone call to speak with [facility] CMA. Patient refused ED recommendation from NT and wanted a call from the clinic. Called [facility] and was advised Rea is with patient and will call patient back.

## 2024-01-11 NOTE — Telephone Encounter (Signed)
 FYI Only or Action Required?: Action required by provider: clinical question for provider.  Patient was last seen in primary care on 09/26/2023 by Mercer Clotilda SAUNDERS, MD.  Called Nurse Triage reporting Nasal Congestion.  Symptoms began yesterday.  Symptoms are: unchanged.  Triage Disposition: Go to ED Now (Notify PCP)  Patient/caregiver understands and will follow disposition?: No, wishes to speak with PCP                   Copied from CRM [id number]. Topic: Clinical - Red Word Triage >> Jan 11, 2024  8:30 AM Avram MATSU wrote: Red Word that prompted transfer to Nurse Triage: mucus stuck in her throat, last night she had difficult time breathing.   ----------------------------------------------------------------------- From previous Reason for Contact - Scheduling: Patient/patient representative is calling to schedule an appointment. Refer to attachments for appointment information. Reason for Disposition  Symptoms of blocked esophagus (e.g., can't swallow normal secretions, can't swallow water, drooling)  Answer Assessment - Initial Assessment Questions This RN recommends ED but pt refuses and wants to make an appointment in office. Pt states she will wait for hours at ED. This RN educated pt on importance of getting emergency treatment but pt refused. This RN notified [facility] of pt refusal of ED disposition. Pt requests a call back from clinic today.     Patient states last night she kept coughing when trying to sleep Onset: last night Pt is not having difficulty breathing  Pt having difficulty swallowing Pt felt something lodged in throat Pt got a little piece of food out Issues getting mucus up  Protocols used: Swallowed Foreign Body-A-AH

## 2024-01-11 NOTE — Telephone Encounter (Signed)
Called and left patient a VM to return call

## 2024-01-25 ENCOUNTER — Ambulatory Visit: Payer: BC Managed Care – PPO | Admitting: Radiology

## 2024-01-30 ENCOUNTER — Ambulatory Visit: Admitting: Obstetrics and Gynecology

## 2024-01-30 ENCOUNTER — Encounter: Payer: Self-pay | Admitting: Obstetrics and Gynecology

## 2024-01-30 VITALS — BP 118/84 | HR 91 | Ht 61.25 in | Wt 170.0 lb

## 2024-01-30 DIAGNOSIS — Z1331 Encounter for screening for depression: Secondary | ICD-10-CM

## 2024-01-30 DIAGNOSIS — Z01419 Encounter for gynecological examination (general) (routine) without abnormal findings: Secondary | ICD-10-CM

## 2024-01-30 NOTE — Progress Notes (Unsigned)
 58 y.o. G0P0000 Single African American female here for annual exam.    Had craniotomy and had meningioma removed at [facility].   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   PCP: Mercer Clotilda SAUNDERS, MD   Patient's last menstrual period was 07/12/1999 (exact date).           Sexually active: No.  The current method of family planning is status post hysterectomy.    Menopausal hormone therapy:  n/a Exercising: Yes.    Walking Smoker:  no  OB History  Gravida Para Term Preterm AB Living  0 0 0 0 0 0  SAB IAB Ectopic Multiple Live Births  0 0 0 0 0     HEALTH MAINTENANCE: Last 2 paps:  01/13/21 neg  History of abnormal Pap or positive HPV:  yes Mammogram:   12/05/23 Breast Density Cat B, BIRADS Cat 1 neg  Colonoscopy:  02/16/17 - polyps - patient will check and see when she is due.  Bone Density:  n/a  Result  n/a   Immunization History  Administered Date(s) Administered   Influenza,inj,Quad PF,6+ Mos 04/06/2017, 02/15/2019   Tdap 12/19/2011   Zoster Recombinant(Shingrix) 02/15/2019      reports that she has never smoked. She has never been exposed to tobacco smoke. She has never used smokeless tobacco. She reports that she does not drink alcohol and does not use drugs.  Past Medical History:  Diagnosis Date   Abnormal Pap smear of cervix    Anemia    due to fibroid tumors   Fibroid    Hypertension    Leiomyoma    Prediabetes 01/23/2020   Urticaria    fish    Past Surgical History:  Procedure Laterality Date   ABDOMINAL HYSTERECTOMY     TAH/LOA   BRAIN SURGERY  11/13/2023   BREAST BIOPSY Right 2017   CHOLECYSTECTOMY     COLONOSCOPY     HYSTEROSCOPY  09/1998   fibroid hysteroscopic resection   MYOMECTOMY  12/1994   multiple   ROBOTIC ASSISTED LAPAROSCOPIC VAGINAL HYSTERECTOMY WITH FIBROID REMOVAL      Current Outpatient Medications  Medication Sig Dispense Refill   hydrochlorothiazide  (HYDRODIURIL ) 12.5 MG tablet Take 1 tablet (12.5 mg total) by mouth daily. 90 tablet 0    lisinopril  (ZESTRIL ) 40 MG tablet Take 1 tablet (40 mg total) by mouth daily. 90 tablet 0   albuterol  (VENTOLIN  HFA) 108 (90 Base) MCG/ACT inhaler TAKE 2 PUFFS BY MOUTH EVERY 6 HOURS AS NEEDED FOR WHEEZE OR SHORTNESS OF BREATH (Patient not taking: Reported on 01/30/2024) 8.5 each 3   AUVI-Q  0.3 MG/0.3ML SOAJ injection Inject 0.3 mg into the muscle as needed for anaphylaxis. (Patient not taking: Reported on 01/30/2024) 1 each 1   benzonatate  (TESSALON ) 200 MG capsule Take 1 capsule (200 mg total) by mouth 2 (two) times daily as needed for cough. (Patient not taking: Reported on 01/30/2024) 20 capsule 0   CLEOCIN 150 MG capsule Take 150 mg by mouth 4 (four) times daily. (Patient not taking: Reported on 01/30/2024)     levETIRAcetam (KEPPRA) 500 MG tablet Take 500 mg by mouth 2 (two) times daily. (Patient not taking: Reported on 01/30/2024)     Multiple Vitamin (MULTIVITAMIN) tablet Take 1 tablet by mouth daily. (Patient not taking: Reported on 01/30/2024)     omeprazole (PRILOSEC) 20 MG capsule Take 20 mg by mouth daily. Take two capsules (Patient not taking: Reported on 01/30/2024)     No current facility-administered medications for this visit.  ALLERGIES: Fish allergy, Strawberry (diagnostic), and Tamiflu [oseltamivir]  Family History  Problem Relation Age of Onset   Breast cancer Mother 33   Hypertension Mother    Thyroid  disease Mother    Lung cancer Mother    Hypertension Father    Diabetes Father    Diabetes Paternal Aunt    Cancer Paternal Uncle        bone   Cancer Paternal Grandmother        bone   Urticaria Neg Hx     Review of Systems  All other systems reviewed and are negative.   PHYSICAL EXAM:  BP 118/84 (BP Location: Left Arm, Patient Position: Sitting)   Pulse 91   Ht 5' 1.25 (1.556 m)   Wt 170 lb (77.1 kg)   LMP 07/12/1999 (Exact Date)   SpO2 96%   BMI 31.86 kg/m     General appearance: alert, cooperative and appears stated age Head: normocephalic, without  obvious abnormality, atraumatic Neck: no adenopathy, supple, symmetrical, trachea midline and thyroid  normal to inspection and palpation Lungs: clear to auscultation bilaterally Breasts: normal appearance, no masses or tenderness, No nipple retraction or dimpling, No nipple discharge or bleeding, No axillary adenopathy Heart: regular rate and rhythm Abdomen: soft, non-tender; no masses, no organomegaly Extremities: extremities normal, atraumatic, no cyanosis or edema Skin: skin color, texture, turgor normal. No rashes or lesions Lymph nodes: cervical, supraclavicular, and axillary nodes normal. Neurologic: grossly normal  Pelvic: External genitalia:  no lesions              No abnormal inguinal nodes palpated.              Urethra:  normal appearing urethra with no masses, tenderness or lesions              Bartholins and Skenes: normal                 Vagina: normal appearing vagina with normal color and discharge, no lesions              Cervix: no lesions              Pap taken: {yes no:314532} Bimanual Exam:  Uterus:  normal size, contour, position, consistency, mobility, non-tender              Adnexa: no mass, fullness, tenderness              Rectal exam: {yes no:314532}.  Confirms.              Anus:  normal sphincter tone, no lesions  Chaperone was present for exam:  {BSCHAPERONE:31226::Emily F, CMA}  ASSESSMENT: Well woman visit with gynecologic exam. Status post TAH for fibroids. 2001.  Ovaries remain.  PHQ-2-9: 0  ***  PLAN: Mammogram screening discussed. Self breast awareness reviewed. Pap and HRV collected:  {yes no:314532} Guidelines for Calcium, Vitamin D , regular exercise program including cardiovascular and weight bearing exercise. Medication refills:  *** {LABS (Optional):23779} Follow up:  ***    Additional counseling given.  {yes [id number]. ***  total time was spent for this patient encounter, including preparation, face-to-face counseling with the  patient, coordination of care, and documentation of the encounter in addition to doing the well woman visit with gynecologic exam.

## 2024-01-30 NOTE — Patient Instructions (Addendum)
 Calcium in Foods Calcium is a mineral in the body. Of all the minerals in your body, you have the most calcium. Most of the body's calcium supply is stored in bones and teeth. Calcium helps many parts of the body work, including: Blood and blood vessels. Nerves. Hormones. Muscles. Bones and teeth. When your calcium stores are low, you may be at risk for low bone mass, bone loss, and broken bones. When you get enough calcium, it helps to support strong bones and teeth throughout your life. Calcium is especially important for: Children during growth spurts. Females during adolescence. Females who are pregnant or breastfeeding. Females after their menstrual cycle stops (postmenopausal). Females whose menstrual cycle has stopped because of an eating disorder or regular intense exercise. People who can't eat or digest dairy products. People who eat a vegan diet. Recommended daily amounts of calcium: Females (ages 87 to 55): 1,000 mg per day. Females (ages 35 and older): 1,200 mg per day. Males (ages 53 to 45): 1,000 mg per day. Males (ages 43 and older): 1,200 mg per day. Females (ages 88 to 28): 1,300 mg per day. Males (ages 41 to 56): 1,300 mg per day. General information Eat foods that are high in calcium. Try to get most of your calcium from food. Some people may benefit from taking calcium supplements. Check with your health care provider or an expert in healthy eating called a dietitian before starting any calcium supplements. Calcium supplements may interact with certain medicines. Too much calcium may cause other health problems, such as trouble pooping and kidney stones. For the body to absorb calcium, it needs vitamin D. Sources of vitamin D include: Skin exposure to direct sunlight. Foods, such as egg yolks, liver, mushrooms, saltwater fish, and fortified milk. Vitamin D supplements. Check with your provider or dietitian before starting any vitamin D supplements. The amount of  calcium that is absorbed in the body varies with type of food. Talk to a dietitian about what foods are best for you, especially if you are eat a vegan diet or don't eat dairy. What foods are high in calcium?  Foods that are high in calcium contain more than 100 milligrams per serving. Fruits Fortified orange juice or other fruit juice, 300 mg per 8 oz (237 mL) serving. Vegetables Collard greens, 260 mg per 1 cup (130 g) serving, cooked. Kale, 180 mg per 1 cup (118 g) serving, cooked. Bok choy, 180 mg per 1 cup (170 g) serving, cooked Grains Fortified frozen waffles, 200 mg in 2 waffles. Oatmeal, 180 mg in 1 cup (234 g) serving, cooked. Fortified white bread, 175 mg per slice. Meats and other proteins Sardines, canned with bones, 350 mg per 3.75 oz (92 g) serving. Salmon, canned with bones, 168 mg per 3 oz (85 g) serving. Canned shrimp, 125 mg per 3 oz (85 g) serving. Baked beans, 120 mg per 1 cup (266 g) serving. Tofu, firm, made with calcium sulfate, 861 mg per  cup (126 g) serving. Dairy Yogurt, plain, low-fat, 448 mg per 1 cup (245 g) serving Nonfat milk, 300 mg per 1 cup (245 g) serving. American cheese, 145 mg per 1 oz (21 g) serving or 1 slice. Cheddar cheese, 200 mg per 1 oz (28 g) serving or 1 slice. Cottage cheese 2%, 125 mg per  cup (113 g) serving. Fortified soy, rice, or almond milk, 300 mg per 1 cup (237 mL) serving. Mozzarella, part skim, 210 mg per 1 oz (21 g) serving. The items listed  above may not be a complete list of foods high in calcium. Actual amounts of calcium may be different depending on processing. Contact a dietitian for more information. What foods are lower in calcium? Foods that are lower in calcium contain 50 mg or less per serving. Fruits Apple, 1 medium, about 6 mg. Banana, 1 medium, about 12 mg. Vegetables Lettuce, 19 mg per 1 cup (35 g) serving. Tomato, 1 small, about 11 mg. Grains Rice, white, 8 mg per  cup (79 g) serving. Boiled  potatoes, 14 mg per 1 cup (160 g) serving. White bread, 6 mg per slice. Meats and other proteins Egg, 24 mg per 1 egg (50 g). Red meat, 7 mg per 4 oz (80 g) serving. Chicken, 17 mg per 4 oz (113 g) serving. Fish, cod, or trout, 20 mg per 4 oz (140 g) serving. Dairy Cream cheese, regular, 14 mg per 1 Tbsp (15 g) serving. Brie cheese, 50 mg per 1 oz (32 g) serving. The items listed above may not be a complete list of foods lower in calcium. Actual amounts of calcium may be different depending on processing. Contact a dietitian for more information. This information is not intended to replace advice given to you by your health care provider. Make sure you discuss any questions you have with your health care provider. Document Revised: 02/24/2023 Document Reviewed: 02/24/2023 Elsevier Patient Education  2024 [employer]  EXERCISE AND DIET:  We recommended that you start or continue a regular exercise program for good health. Regular exercise means any activity that makes your heart beat faster and makes you sweat.  We recommend exercising at least 30 minutes per day at least 3 days a week, preferably 4 or 5.  We also recommend a diet low in fat and sugar.  Inactivity, poor dietary choices and obesity can cause diabetes, heart attack, stroke, and kidney damage, among others.    ALCOHOL AND SMOKING:  Women should limit their alcohol intake to no more than 7 drinks/beers/glasses of wine (combined, not each!) per week. Moderation of alcohol intake to this level decreases your risk of breast cancer and liver damage. And of course, no recreational drugs are part of a healthy lifestyle.  And absolutely no smoking or even second hand smoke. Most people know smoking can cause heart and lung diseases, but did you know it also contributes to weakening of your bones? Aging of your skin?  Yellowing of your teeth and nails?  CALCIUM AND VITAMIN D:  Adequate intake of calcium and Vitamin D are recommended.  The  recommendations for exact amounts of these supplements seem to change often, but generally speaking 600 mg of calcium (either carbonate or citrate) and 800 units of Vitamin D per day seems prudent. Certain women may benefit from higher intake of Vitamin D.  If you are among these women, your doctor will have told you during your visit.    PAP SMEARS:  Pap smears, to check for cervical cancer or precancers,  have traditionally been done yearly, although recent scientific advances have shown that most women can have pap smears less often.  However, every woman still should have a physical exam from her gynecologist every year. It will include a breast check, inspection of the vulva and vagina to check for abnormal growths or skin changes, a visual exam of the cervix, and then an exam to evaluate the size and shape of the uterus and ovaries.  And after 59 years of age, a rectal exam  is indicated to check for rectal cancers. We will also provide age appropriate advice regarding health maintenance, like when you should have certain vaccines, screening for sexually transmitted diseases, bone density testing, colonoscopy, mammograms, etc.   MAMMOGRAMS:  All women over 11 years old should have a yearly mammogram. Many facilities now offer a "3D" mammogram, which may cost around $50 extra out of pocket. If possible,  we recommend you accept the option to have the 3D mammogram performed.  It both reduces the number of women who will be called back for extra views which then turn out to be normal, and it is better than the routine mammogram at detecting truly abnormal areas.    COLONOSCOPY:  Colonoscopy to screen for colon cancer is recommended for all women at age 8.  We know, you hate the idea of the prep.  We agree, BUT, having colon cancer and not knowing it is worse!!  Colon cancer so often starts as a polyp that can be seen and removed at colonscopy, which can quite literally save your life!  And if your first  colonoscopy is normal and you have no family history of colon cancer, most women don't have to have it again for 10 years.  Once every ten years, you can do something that may end up saving your life, right?  We will be happy to help you get it scheduled when you are ready.  Be sure to check your insurance coverage so you understand how much it will cost.  It may be covered as a preventative service at no cost, but you should check your particular policy.

## 2024-03-10 ENCOUNTER — Ambulatory Visit: Admitting: Family Medicine

## 2024-03-10 ENCOUNTER — Encounter: Payer: Self-pay | Admitting: Family Medicine

## 2024-03-10 VITALS — BP 138/90 | HR 90 | Temp 98.3°F | Ht 61.25 in | Wt 172.8 lb

## 2024-03-10 DIAGNOSIS — I10 Essential (primary) hypertension: Secondary | ICD-10-CM | POA: Diagnosis not present

## 2024-03-10 DIAGNOSIS — Z23 Encounter for immunization: Secondary | ICD-10-CM

## 2024-03-10 DIAGNOSIS — R053 Chronic cough: Secondary | ICD-10-CM

## 2024-03-10 DIAGNOSIS — Z86018 Personal history of other benign neoplasm: Secondary | ICD-10-CM

## 2024-03-10 MED ORDER — IRBESARTAN 75 MG PO TABS: 75.0000 mg | ORAL_TABLET | Freq: Every day | ORAL | 3 refills | Status: DC

## 2024-03-10 NOTE — Patient Instructions (Addendum)
 A prescription for irbesartan 75 mg daily was sent to your pharmacy.  Stop taking lisinopril  40 mg daily.  Monitor your blood pressure at home.  The new blood pressure medication is generally well-tolerated but if you notice any swelling of your mouth, tongue, throat stop taking the medication.  We will have you follow-up in the next 4-6 weeks to see how the medication is working.  If you have issues with the medication prior to this do not hesitate to contact clinic and stop the medication.

## 2024-03-10 NOTE — Progress Notes (Signed)
 Established Patient Office Visit   Subjective  Patient ID: Cindy Zimmerman, female    DOB: [birth year]  Age: 58 y.o. MRN: [id number]  Chief Complaint  Patient presents with   Medical Management of Chronic Issues    Patient came in today for a medication check, patient is coughing at night     Pt is a 58 yo female seen for f/u.  Concerned lisinopril  40 mg is causing it as she takes it at night. Tried taking meds in am, but was forgetting to take them.  Bp has been controlled at various office visits and when school nurse checks it once per wk.  Also taking hydrochlorothiazide  12.5 mg daily.  Pt is doing much better s/p meningioma resection over the summer.  Pt interested in flu and tdap vaccines.    Patient Active Problem List   Diagnosis Date Noted   Prediabetes 01/23/2020   Hypertensive disorder 04/06/2017   Vitamin D  deficiency 04/06/2017   Iron deficiency anemia 04/06/2017   Past Medical History:  Diagnosis Date   Abnormal Pap smear of cervix    Anemia    due to fibroid tumors   Fibroid    Hypertension    Leiomyoma    Prediabetes 01/23/2020   Urticaria    fish   Past Surgical History:  Procedure Laterality Date   ABDOMINAL HYSTERECTOMY     TAH/LOA   BRAIN SURGERY  11/13/2023   BREAST BIOPSY Right 2017   CHOLECYSTECTOMY     COLONOSCOPY     HYSTEROSCOPY  09/1998   fibroid hysteroscopic resection   MYOMECTOMY  12/1994   multiple   ROBOTIC ASSISTED LAPAROSCOPIC VAGINAL HYSTERECTOMY WITH FIBROID REMOVAL     Social History   Tobacco Use   Smoking status: Never    Passive exposure: Never   Smokeless tobacco: Never  Vaping Use   Vaping status: Never Used  Substance Use Topics   Alcohol use: No    Alcohol/week: 0.0 standard drinks of alcohol   Drug use: No   Family History  Problem Relation Age of Onset   Breast cancer Mother 40   Hypertension Mother    Thyroid  disease Mother    Lung cancer Mother    Hypertension Father    Diabetes Father    Diabetes  Paternal Aunt    Cancer Paternal Uncle        bone   Cancer Paternal Grandmother        bone   Urticaria Neg Hx    Allergies  Allergen Reactions   Fish Allergy Anaphylaxis and Hives   Strawberry (Diagnostic) Anaphylaxis, Swelling and Other (See Comments)    Eyes swell   Tamiflu [Oseltamivir] Hives    ROS Negative unless stated above    Objective:     BP (!) 138/90 (BP Location: Left Arm, Patient Position: Sitting, Cuff Size: Large)   Pulse 90   Temp 98.3 F (36.8 C) (Oral)   Ht 5' 1.25 (1.556 m)   Wt 172 lb 12.8 oz (78.4 kg)   LMP 07/12/1999 (Exact Date)   SpO2 98%   BMI 32.38 kg/m  BP Readings from Last 3 Encounters:  03/10/24 (!) 138/90  01/30/24 118/84  09/26/23 (!) 160/90   Wt Readings from Last 3 Encounters:  03/10/24 172 lb 12.8 oz (78.4 kg)  01/30/24 170 lb (77.1 kg)  09/26/23 168 lb 3.2 oz (76.3 kg)      Physical Exam Constitutional:      General: She is not in  acute distress.    Appearance: Normal appearance.  HENT:     Head: Normocephalic and atraumatic.     Nose: Nose normal.     Mouth/Throat:     Mouth: Mucous membranes are moist.  Cardiovascular:     Rate and Rhythm: Normal rate and regular rhythm.     Heart sounds: Normal heart sounds. No murmur heard.    No gallop.  Pulmonary:     Effort: Pulmonary effort is normal. No respiratory distress.     Breath sounds: Normal breath sounds. No wheezing, rhonchi or rales.  Skin:    General: Skin is warm and dry.     Comments: Well healed surgical incision in L frontal area with in hair line.  Neurological:     Mental Status: She is alert and oriented to person, place, and time.        01/30/2024    3:21 PM 02/15/2023    4:39 PM 01/04/2022    9:51 AM  Depression screen PHQ 2/9  Decreased Interest 0 0 0  Down, Depressed, Hopeless 0 0 0  PHQ - 2 Score 0 0 0  Altered sleeping  0 0  Tired, decreased energy  0 0  Change in appetite  0 0  Feeling bad or failure about yourself   0 0  Trouble  concentrating  0 0  Moving slowly or fidgety/restless  0 0  Suicidal thoughts  0 0  PHQ-9 Score  0 0  Difficult doing work/chores  Not difficult at all Not difficult at all      02/15/2023    4:39 PM  GAD 7 : Generalized Anxiety Score  Nervous, Anxious, on Edge 0  Control/stop worrying 0  Worry too much - different things 0  Trouble relaxing 0  Restless 0  Easily annoyed or irritable 0  Afraid - awful might happen 0  Total GAD 7 Score 0  Anxiety Difficulty Not difficult at all     No results found for any visits on 03/10/24.    Assessment & Plan:   Chronic cough  Essential hypertension -     Irbesartan; Take 1 tablet (75 mg total) by mouth daily.  Dispense: 30 tablet; Refill: 3  Need for influenza vaccination -     Tdap vaccine greater than or equal to 7yo IM; Future  Need for Tdap vaccination -     Flu vaccine trivalent PF, 6mos and older(Flulaval,Afluria,Fluarix,Fluzone); Future  History of meningioma  Stop lisinopril  as may be causing cough worse at night.  Start irbesartan 75 mg daily.  Continue HCTZ 12.5 mg daily.  Monitor blood pressure.  Continue lifestyle modifications.  For continued cough consider other causes such as allergies or GERD.  Tdap and influenza vaccines given this visit.  History of meningioma status post resection.  Doing well.  Continue f/u with Neurosurgery prn.  Return in about 5 weeks (around 04/14/2024).    Clotilda JONELLE Single, MD

## 2024-06-05 ENCOUNTER — Other Ambulatory Visit: Payer: Self-pay | Admitting: Family Medicine

## 2024-06-05 DIAGNOSIS — I10 Essential (primary) hypertension: Secondary | ICD-10-CM

## 2024-06-23 ENCOUNTER — Ambulatory Visit: Admitting: Family Medicine

## 2024-06-23 ENCOUNTER — Encounter: Payer: Self-pay | Admitting: Family Medicine

## 2024-06-23 VITALS — BP 164/100 | HR 83 | Temp 98.3°F | Ht 61.25 in | Wt 174.6 lb

## 2024-06-23 DIAGNOSIS — I10 Essential (primary) hypertension: Secondary | ICD-10-CM | POA: Diagnosis not present

## 2024-06-23 MED ORDER — IRBESARTAN 150 MG PO TABS: 150.0000 mg | ORAL_TABLET | Freq: Every day | ORAL | 3 refills | Status: AC

## 2024-06-23 NOTE — Progress Notes (Addendum)
 "  Established Patient Office Visit   Subjective  Patient ID: Cindy Zimmerman, female    DOB: March 13, 1966  Age: 59 y.o. MRN: [id number]  Chief Complaint  Patient presents with   Medical Management of Chronic Issues    Blood pressure follow-up     Patient is a 59 year old female seen for follow-up on HTN.  Patient taking irbesartan  75 mg at night.  Medication started last OFV as lisinopril  was discontinued due to causing a dry cough.  In the past patient was also on HCTZ 12.5 mg.  Patient tried to make changes to diet.  Had a sandwich with deli meat, chips, and Sprite for lunch today.  Having nurse at school checked her blood pressure.  BP today was 139/90.  Patient denies headaches, CP, changes in vision, LE edema, and snoring.  Pt considering exercising with a co-worker a few times per wk.    Patient Active Problem List   Diagnosis Date Noted   Prediabetes 01/23/2020   Hypertensive disorder 04/06/2017   Vitamin D  deficiency 04/06/2017   Iron deficiency anemia 04/06/2017   Past Medical History:  Diagnosis Date   Abnormal Pap smear of cervix    Anemia    due to fibroid tumors   Fibroid    Hypertension    Leiomyoma    Prediabetes 01/23/2020   Urticaria    fish   Past Surgical History:  Procedure Laterality Date   ABDOMINAL HYSTERECTOMY     TAH/LOA   BRAIN SURGERY  11/13/2023   BREAST BIOPSY Right 2017   CHOLECYSTECTOMY     COLONOSCOPY     HYSTEROSCOPY  09/1998   fibroid hysteroscopic resection   MYOMECTOMY  12/1994   multiple   ROBOTIC ASSISTED LAPAROSCOPIC VAGINAL HYSTERECTOMY WITH FIBROID REMOVAL     Social History[1] Family History  Problem Relation Age of Onset   Breast cancer Mother 37   Hypertension Mother    Thyroid  disease Mother    Lung cancer Mother    Hypertension Father    Diabetes Father    Diabetes Paternal Aunt    Cancer Paternal Uncle        bone   Cancer Paternal Grandmother        bone   Urticaria Neg Hx    Allergies[2]  ROS Negative  unless stated above    Objective:     BP (!) 164/100 (BP Location: Left Arm, Patient Position: Sitting, Cuff Size: Large)   Pulse 83   Temp 98.3 F (36.8 C) (Oral)   Ht 5' 1.25 (1.556 m)   Wt 174 lb 9.6 oz (79.2 kg)   LMP 07/12/1999   SpO2 98%   BMI 32.72 kg/m  BP Readings from Last 3 Encounters:  06/23/24 (!) 164/100  03/10/24 (!) 138/90  01/30/24 118/84   Wt Readings from Last 3 Encounters:  06/23/24 174 lb 9.6 oz (79.2 kg)  03/10/24 172 lb 12.8 oz (78.4 kg)  01/30/24 170 lb (77.1 kg)      Physical Exam Constitutional:      General: She is not in acute distress.    Appearance: Normal appearance.  HENT:     Head: Normocephalic and atraumatic.     Nose: Nose normal.     Mouth/Throat:     Mouth: Mucous membranes are moist.  Cardiovascular:     Rate and Rhythm: Normal rate and regular rhythm.     Heart sounds: Normal heart sounds. No murmur heard.    No gallop.  Pulmonary:  Effort: Pulmonary effort is normal. No respiratory distress.     Breath sounds: Normal breath sounds. No wheezing, rhonchi or rales.  Skin:    General: Skin is warm and dry.  Neurological:     Mental Status: She is alert and oriented to person, place, and time.        03/10/2024    5:07 PM 01/30/2024    3:21 PM 02/15/2023    4:39 PM  Depression screen PHQ 2/9  Decreased Interest 0 0 0  Down, Depressed, Hopeless 0 0 0  PHQ - 2 Score 0 0 0  Altered sleeping 0  0  Tired, decreased energy 1  0  Change in appetite 0  0  Feeling bad or failure about yourself  0  0  Trouble concentrating 0  0  Moving slowly or fidgety/restless 0  0  Suicidal thoughts 0  0  PHQ-9 Score 1   0   Difficult doing work/chores Not difficult at all  Not difficult at all     Data saved with a previous flowsheet row definition      03/10/2024    5:08 PM 02/15/2023    4:39 PM  GAD 7 : Generalized Anxiety Score  Nervous, Anxious, on Edge 0 0  Control/stop worrying 0 0  Worry too much - different things 0 0   Trouble relaxing 0 0  Restless 0 0  Easily annoyed or irritable 0 0  Afraid - awful might happen 0 0  Total GAD 7 Score 0 0  Anxiety Difficulty Not difficult at all Not difficult at all     No results found for any visits on 06/23/24.    Assessment & Plan:   Essential hypertension -     Irbesartan ; Take 1 tablet (150 mg total) by mouth daily.  Dispense: 90 tablet; Refill: 3  BP uncontrolled.  D/c irbesartan  75 mg.  As pt just picked up new rx will take 2 irbesartan  75 mg tabs for a total daily dose of 150 mg.  New rx for irbesartan  150 mg one tab daily sent, pick up when needed.  Monitor bp at home and at school.  For continued elevation or elevation in evening consider restarting hydrochlorothiazide  12.5 mg in am.  Lifestyle modifications.  Read labels for sodium content.  F/u  in 2-3 wks.  Return in about 2 weeks (around 07/07/2024).   Clotilda JONELLE Single, MD     [1]  Social History Tobacco Use   Smoking status: Never    Passive exposure: Never   Smokeless tobacco: Never  Vaping Use   Vaping status: Never Used  Substance Use Topics   Alcohol use: No    Alcohol/week: 0.0 standard drinks of alcohol   Drug use: No  [2]  Allergies Allergen Reactions   Fish Allergy Anaphylaxis and Hives   Strawberry (Diagnostic) Anaphylaxis, Swelling and Other (See Comments)    Eyes swell   Tamiflu [Oseltamivir] Hives   "

## 2024-07-07 ENCOUNTER — Ambulatory Visit: Admitting: Family Medicine

## 2024-07-09 ENCOUNTER — Other Ambulatory Visit: Payer: Self-pay | Admitting: Family Medicine

## 2024-07-09 DIAGNOSIS — I10 Essential (primary) hypertension: Secondary | ICD-10-CM

## 2024-07-17 ENCOUNTER — Ambulatory Visit: Admitting: Family Medicine

## 2024-07-21 ENCOUNTER — Ambulatory Visit: Admitting: Family Medicine

## 2025-02-02 ENCOUNTER — Ambulatory Visit: Admitting: Obstetrics and Gynecology
